# Patient Record
Sex: Male | Born: 1955 | Race: Black or African American | Hispanic: No | Marital: Married | Smoking: Never smoker
Health system: Southern US, Community
[De-identification: ages and names within clinical notes are randomized; demographics above are authoritative.]

## PROBLEM LIST (undated history)

## (undated) DIAGNOSIS — G629 Polyneuropathy, unspecified: Secondary | ICD-10-CM

## (undated) DIAGNOSIS — N400 Enlarged prostate without lower urinary tract symptoms: Secondary | ICD-10-CM

## (undated) DIAGNOSIS — Z87442 Personal history of urinary calculi: Secondary | ICD-10-CM

## (undated) HISTORY — DX: Benign prostatic hyperplasia without lower urinary tract symptoms: N40.0

---

## 2011-10-17 DIAGNOSIS — N400 Enlarged prostate without lower urinary tract symptoms: Secondary | ICD-10-CM | POA: Insufficient documentation

## 2016-02-08 DIAGNOSIS — G8929 Other chronic pain: Secondary | ICD-10-CM | POA: Insufficient documentation

## 2017-07-15 HISTORY — PX: JOINT REPLACEMENT: SHX530

## 2017-09-05 DIAGNOSIS — M1710 Unilateral primary osteoarthritis, unspecified knee: Secondary | ICD-10-CM | POA: Insufficient documentation

## 2017-12-19 DIAGNOSIS — Z96652 Presence of left artificial knee joint: Secondary | ICD-10-CM | POA: Insufficient documentation

## 2019-10-06 ENCOUNTER — Other Ambulatory Visit: Payer: Self-pay | Admitting: Family Medicine

## 2019-10-06 ENCOUNTER — Ambulatory Visit (INDEPENDENT_AMBULATORY_CARE_PROVIDER_SITE_OTHER): Payer: 59 | Admitting: Family Medicine

## 2019-10-06 ENCOUNTER — Other Ambulatory Visit: Payer: Self-pay

## 2019-10-06 ENCOUNTER — Encounter: Payer: Self-pay | Admitting: Family Medicine

## 2019-10-06 VITALS — BP 130/80 | HR 55 | Temp 98.2°F | Ht 66.0 in | Wt 179.0 lb

## 2019-10-06 DIAGNOSIS — Z1329 Encounter for screening for other suspected endocrine disorder: Secondary | ICD-10-CM | POA: Diagnosis not present

## 2019-10-06 DIAGNOSIS — R3912 Poor urinary stream: Secondary | ICD-10-CM

## 2019-10-06 DIAGNOSIS — N529 Male erectile dysfunction, unspecified: Secondary | ICD-10-CM | POA: Diagnosis not present

## 2019-10-06 DIAGNOSIS — Z13220 Encounter for screening for lipoid disorders: Secondary | ICD-10-CM

## 2019-10-06 DIAGNOSIS — Z96652 Presence of left artificial knee joint: Secondary | ICD-10-CM | POA: Diagnosis not present

## 2019-10-06 DIAGNOSIS — Z130 Encounter for screening for diseases of the blood and blood-forming organs and certain disorders involving the immune mechanism: Secondary | ICD-10-CM | POA: Diagnosis not present

## 2019-10-06 DIAGNOSIS — Z0000 Encounter for general adult medical examination without abnormal findings: Secondary | ICD-10-CM

## 2019-10-06 DIAGNOSIS — Z0001 Encounter for general adult medical examination with abnormal findings: Secondary | ICD-10-CM

## 2019-10-06 DIAGNOSIS — N401 Enlarged prostate with lower urinary tract symptoms: Secondary | ICD-10-CM | POA: Diagnosis not present

## 2019-10-06 DIAGNOSIS — Z7689 Persons encountering health services in other specified circumstances: Secondary | ICD-10-CM

## 2019-10-06 MED ORDER — SILDENAFIL CITRATE 100 MG PO TABS: 50.0000 mg | ORAL_TABLET | Freq: Every day | ORAL | 11 refills | Status: DC | PRN

## 2019-10-06 NOTE — Patient Instructions (Signed)
Fasting labs are ordered.   Come in at your convenience

## 2019-10-06 NOTE — Progress Notes (Signed)
Anders Hohmann is a 64 y.o. male presents to office today for annual physical exam examination.    Concerns today include: 1.   BPH Patient reports couple year history of BPH with urinary frequency and weak urinary stream.  He uses an over-the-counter prostate health medication but does not find that it specifically impacts his symptoms.  He notes that he was given a medication previously by previous PCP that caused excessive arousals so he discontinued the medicine.  He does have issues with erectile dysfunction and firmness.  He is able to achieve an erection.  While he prefers to rely on supplements, he would be willing to try a as needed medication for this.  No history of heart disease.  Occupation: retired from [employer], Marital status: married, Education officer, museum, Substance use: none Diet: balance, Exercise: regular Last eye exam: needs new glasses Last dental exam: UTD Last colonoscopy: UTD Immunizations needed: UTD  Past Medical History:  Diagnosis Date  . BPH (benign prostatic hyperplasia)    Social History   Socioeconomic History  . Marital status: Married    Spouse name: Rise Paganini  . Number of children: 2  . Years of education: Not on file  . Highest education level: Not on file  Occupational History  . 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 Smoking status: Never Smoker  . Smokeless tobacco: Never Used  Substance and Sexual Activity  . Alcohol use: Never  . Drug use: Never  . Sexual activity: Not on file  Other Topics Concern  . Not on file  Social History Narrative   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  They have 2 children, a son and a daughter who reside in [place].   Patient enjoys being physically active.  He tries to maintain health through proper diet, exercise and herbal remedies.   Social Determinants of Health    Financial Resource Strain:   . Difficulty of Paying Living Expenses:   Food Insecurity:   . Worried About Charity fundraiser in the Last Year:   . Arboriculturist in the Last Year:   Transportation Needs:   . Film/video editor (Medical):   Marland Kitchen Lack of Transportation (Non-Medical):   Physical Activity:   . Days of Exercise per Week:   . Minutes of Exercise per Session:   Stress:   . Feeling of Stress :   Social Connections:   . Frequency of Communication with Friends and Family:   . Frequency of Social Gatherings with Friends and Family:   . Attends Religious Services:   . Active Member of Clubs or Organizations:   . Attends Archivist Meetings:   Marland Kitchen Marital Status:   Intimate Partner Violence:   . Fear of Current or Ex-Partner:   . Emotionally Abused:   Marland Kitchen Physically Abused:   . Sexually Abused:    Past Surgical History:  Procedure Laterality Date  . JOINT REPLACEMENT  2019   left knee   Family History  Problem Relation Age of Onset  . Heart disease Mother   . Lung cancer Brother     Current Outpatient Medications:  .  sildenafil (VIAGRA) 100 MG tablet, Take 0.5-1 tablets (50-100 mg total) by mouth daily as needed for erectile dysfunction. Please give max qty per ins allowance, Disp: 10 tablet, Rfl: 11  No  Active Allergies   ROS: Review of Systems Pertinent items noted in HPI and remainder of comprehensive ROS otherwise negative.    Physical exam BP 130/80   Pulse (!) 55   Temp 98.2 F (36.8 C) (Temporal)   Ht 5' 6"  (1.676 m)   Wt 179 lb (81.2 kg)   SpO2 97%   BMI 28.89 kg/m  General appearance: alert, cooperative, appears stated age and no distress Head: Normocephalic, without obvious abnormality, atraumatic Eyes: negative findings: lids and lashes normal, conjunctivae and sclerae normal, corneas clear and pupils equal, round, reactive to light and accomodation Ears: normal TM's and external ear canals both ears Nose: Nares normal. Septum  midline. Mucosa normal. No drainage or sinus tenderness. Throat: lips, mucosa, and tongue normal; teeth and gums normal Neck: no adenopathy, no carotid bruit, no JVD, supple, symmetrical, trachea midline and thyroid not enlarged, symmetric, no tenderness/mass/nodules Back: symmetric, no curvature. ROM normal. No CVA tenderness. Lungs: clear to auscultation bilaterally Chest wall: no tenderness Heart: regular rate and rhythm, S1, S2 normal, no murmur, click, rub or gallop Abdomen: soft, non-tender; bowel sounds normal; no masses,  no organomegaly Extremities: extremities normal, atraumatic, no cyanosis or edema Pulses: 2+ and symmetric Skin: Skin color, texture, turgor normal. No rashes or lesions Lymph nodes: Cervical, supraclavicular, and axillary nodes normal. Neurologic: Alert and oriented X 3, normal strength and tone. Normal symmetric reflexes. Normal coordination and gait Psych: Pleasant, interactive.  Mood stable, speech normal, affect appropriate.  Good eye contact. Depression screen [facility] 2/9 10/06/2019  Decreased Interest 0  Down, Depressed, Hopeless 0  PHQ - 2 Score 0  Altered sleeping 0  Tired, decreased energy 0  Change in appetite 0  Feeling bad or failure about yourself  0  Trouble concentrating 0  Moving slowly or fidgety/restless 0  Suicidal thoughts 0  PHQ-9 Score 0   Assessment/ Plan: Ella Guillotte here for annual physical exam.   1. Annual physical exam I reviewed the records available.  I have put these in the pile for scanning into his electronic medical record  2. Erectile dysfunction, unspecified erectile dysfunction type Trial of Viagra.  We discussed red flag signs and symptoms and reasons for emergent evaluation. - sildenafil (VIAGRA) 100 MG tablet; Take 0.5-1 tablets (50-100 mg total) by mouth daily as needed for erectile dysfunction. Please give max qty per ins allowance  Dispense: 10 tablet; Refill: 11 - CMP14+EGFR; Future  3. Establishing care with  new doctor, encounter for  4. Status post left knee replacement Doing well.  Remains physically active  5. Benign prostatic hyperplasia with weak urinary stream Advised no intercourse for 24 hours prior to exam. - PSA; Future  6. Screening, lipid - Lipid panel; Future  7. Screening for thyroid disorder - TSH; Future  8. Screening, anemia, deficiency, iron - CBC; Future  Patient to follow up in 1 year for annual exam or sooner if needed.  Isaiha Asare M. Lajuana Ripple, DO

## 2019-10-13 ENCOUNTER — Other Ambulatory Visit: Payer: Self-pay

## 2019-10-13 ENCOUNTER — Other Ambulatory Visit: Payer: 59

## 2019-10-13 DIAGNOSIS — Z1329 Encounter for screening for other suspected endocrine disorder: Secondary | ICD-10-CM

## 2019-10-13 DIAGNOSIS — Z130 Encounter for screening for diseases of the blood and blood-forming organs and certain disorders involving the immune mechanism: Secondary | ICD-10-CM

## 2019-10-13 DIAGNOSIS — N401 Enlarged prostate with lower urinary tract symptoms: Secondary | ICD-10-CM

## 2019-10-13 DIAGNOSIS — N529 Male erectile dysfunction, unspecified: Secondary | ICD-10-CM

## 2019-10-13 DIAGNOSIS — R3912 Poor urinary stream: Secondary | ICD-10-CM

## 2019-10-13 DIAGNOSIS — Z13220 Encounter for screening for lipoid disorders: Secondary | ICD-10-CM

## 2019-10-14 LAB — CMP14+EGFR
ALT: 17 IU/L (ref 0–44)
AST: 21 IU/L (ref 0–40)
Albumin/Globulin Ratio: 2.2 (ref 1.2–2.2)
Albumin: 4.3 g/dL (ref 3.8–4.8)
Alkaline Phosphatase: 74 IU/L (ref 39–117)
BUN/Creatinine Ratio: 12 (ref 10–24)
BUN: 11 mg/dL (ref 8–27)
Bilirubin Total: 0.2 mg/dL (ref 0.0–1.2)
CO2: 22 mmol/L (ref 20–29)
Calcium: 8.9 mg/dL (ref 8.6–10.2)
Chloride: 105 mmol/L (ref 96–106)
Creatinine, Ser: 0.94 mg/dL (ref 0.76–1.27)
GFR calc Af Amer: 100 mL/min/{1.73_m2} (ref >59)
GFR calc non Af Amer: 87 mL/min/{1.73_m2} (ref >59)
Globulin, Total: 2 g/dL (ref 1.5–4.5)
Glucose: 97 mg/dL (ref 65–99)
Potassium: 4.2 mmol/L (ref 3.5–5.2)
Sodium: 140 mmol/L (ref 134–144)
Total Protein: 6.3 g/dL (ref 6.0–8.5)

## 2019-10-14 LAB — CBC
Hematocrit: 44.9 % (ref 37.5–51.0)
Hemoglobin: 14.7 g/dL (ref 13.0–17.7)
MCH: 29.1 pg (ref 26.6–33.0)
MCHC: 32.7 g/dL (ref 31.5–35.7)
MCV: 89 fL (ref 79–97)
Platelets: 220 10*3/uL (ref 150–450)
RBC: 5.06 x10E6/uL (ref 4.14–5.80)
RDW: 13 % (ref 11.6–15.4)
WBC: 4.7 10*3/uL (ref 3.4–10.8)

## 2019-10-14 LAB — LIPID PANEL
Chol/HDL Ratio: 4.2 ratio (ref 0.0–5.0)
Cholesterol, Total: 152 mg/dL (ref 100–199)
HDL: 36 mg/dL — ABNORMAL LOW (ref >39)
LDL Chol Calc (NIH): 94 mg/dL (ref 0–99)
Triglycerides: 122 mg/dL (ref 0–149)
VLDL Cholesterol Cal: 22 mg/dL (ref 5–40)

## 2019-10-14 LAB — PSA: Prostate Specific Ag, Serum: 4.7 ng/mL — ABNORMAL HIGH (ref 0.0–4.0)

## 2019-10-14 LAB — TSH: TSH: 2.01 u[IU]/mL (ref 0.450–4.500)

## 2020-07-15 HISTORY — PX: EYE SURGERY: SHX253

## 2020-08-09 DIAGNOSIS — H40023 Open angle with borderline findings, high risk, bilateral: Secondary | ICD-10-CM | POA: Insufficient documentation

## 2020-08-09 DIAGNOSIS — H25813 Combined forms of age-related cataract, bilateral: Secondary | ICD-10-CM | POA: Insufficient documentation

## 2021-01-30 ENCOUNTER — Encounter: Payer: Self-pay | Admitting: Family Medicine

## 2021-01-30 ENCOUNTER — Other Ambulatory Visit: Payer: Self-pay

## 2021-01-30 ENCOUNTER — Ambulatory Visit (INDEPENDENT_AMBULATORY_CARE_PROVIDER_SITE_OTHER): Payer: No Typology Code available for payment source | Admitting: Family Medicine

## 2021-01-30 VITALS — BP 140/86 | HR 64 | Temp 97.8°F | Ht 66.0 in | Wt 174.2 lb

## 2021-01-30 DIAGNOSIS — Z0001 Encounter for general adult medical examination with abnormal findings: Secondary | ICD-10-CM | POA: Diagnosis not present

## 2021-01-30 DIAGNOSIS — Z0000 Encounter for general adult medical examination without abnormal findings: Secondary | ICD-10-CM

## 2021-01-30 DIAGNOSIS — Z13220 Encounter for screening for lipoid disorders: Secondary | ICD-10-CM | POA: Diagnosis not present

## 2021-01-30 DIAGNOSIS — N401 Enlarged prostate with lower urinary tract symptoms: Secondary | ICD-10-CM

## 2021-01-30 DIAGNOSIS — R972 Elevated prostate specific antigen [PSA]: Secondary | ICD-10-CM | POA: Diagnosis not present

## 2021-01-30 DIAGNOSIS — R3912 Poor urinary stream: Secondary | ICD-10-CM

## 2021-01-30 DIAGNOSIS — Z1329 Encounter for screening for other suspected endocrine disorder: Secondary | ICD-10-CM

## 2021-01-30 NOTE — Patient Instructions (Signed)
Please remember to have your labs sent to me.  Shingles vaccinations need to be separated by at least 2 months but no more than 6.  Shoot for about a 4 month nurse visit for 2nd vaccine  Health Maintenance, Male Adopting a healthy lifestyle and getting preventive care are important in promoting health and wellness. Ask your health care provider about: The right schedule for you to have regular tests and exams. Things you can do on your own to prevent diseases and keep yourself healthy. What should I know about diet, weight, and exercise? Eat a healthy diet  Eat a diet that includes plenty of vegetables, fruits, low-fat dairy products, and lean protein. Do not eat a lot of foods that are high in solid fats, added sugars, or sodium.  Maintain a healthy weight Body mass index (BMI) is a measurement that can be used to identify possible weight problems. It estimates body fat based on height and weight. Your health care provider can help determine your BMI and help you achieve or maintain ahealthy weight. Get regular exercise Get regular exercise. This is one of the most important things you can do for your health. Most adults should: Exercise for at least 150 minutes each week. The exercise should increase your heart rate and make you sweat (moderate-intensity exercise). Do strengthening exercises at least twice a week. This is in addition to the moderate-intensity exercise. Spend less time sitting. Even light physical activity can be beneficial. Watch cholesterol and blood lipids Have your blood tested for lipids and cholesterol at 64 years of age, then havethis test every 5 years. You may need to have your cholesterol levels checked more often if: Your lipid or cholesterol levels are high. You are older than 64 years of age. You are at high risk for heart disease. What should I know about cancer screening? Many types of cancers can be detected early and may often be prevented. Depending on  your health history and family history, you may need to have cancer screening at various ages. This may include screening for: Colorectal cancer. Prostate cancer. Skin cancer. Lung cancer. What should I know about heart disease, diabetes, and high blood pressure? Blood pressure and heart disease High blood pressure causes heart disease and increases the risk of stroke. This is more likely to develop in people who have high blood pressure readings, are of African descent, or are overweight. Talk with your health care provider about your target blood pressure readings. Have your blood pressure checked: Every 3-5 years if you are 60-60 years of age. Every year if you are 15 years old or older. If you are between the ages of 36 and 22 and are a current or former smoker, ask your health care provider if you should have a one-time screening for abdominal aortic aneurysm (AAA). Diabetes Have regular diabetes screenings. This checks your fasting blood sugar level. Have the screening done: Once every three years after age 69 if you are at a normal weight and have a low risk for diabetes. More often and at a younger age if you are overweight or have a high risk for diabetes. What should I know about preventing infection? Hepatitis B If you have a higher risk for hepatitis B, you should be screened for this virus. Talk with your health care provider to find out if you are at risk forhepatitis B infection. Hepatitis C Blood testing is recommended for: Everyone born from [birth year] through 1964. Anyone with known risk factors for  hepatitis C. Sexually transmitted infections (STIs) You should be screened each year for STIs, including gonorrhea and chlamydia, if: You are sexually active and are younger than 64 years of age. You are older than 64 years of age and your health care provider tells you that you are at risk for this type of infection. Your sexual activity has changed since you were last screened,  and you are at increased risk for chlamydia or gonorrhea. Ask your health care provider if you are at risk. Ask your health care provider about whether you are at high risk for HIV. Your health care provider may recommend a prescription medicine to help prevent HIV infection. If you choose to take medicine to prevent HIV, you should first get tested for HIV. You should then be tested every 3 months for as long as you are taking the medicine. Follow these instructions at home: Lifestyle Do not use any products that contain nicotine or tobacco, such as cigarettes, e-cigarettes, and chewing tobacco. If you need help quitting, ask your health care provider. Do not use street drugs. Do not share needles. Ask your health care provider for help if you need support or information about quitting drugs. Alcohol use Do not drink alcohol if your health care provider tells you not to drink. If you drink alcohol: Limit how much you have to 0-2 drinks a day. Be aware of how much alcohol is in your drink. In the [place] one drink equals one 12 oz bottle of beer (355 mL), one 5 oz glass of wine (148 mL), or one 1 oz glass of hard liquor (44 mL). General instructions Schedule regular health, dental, and eye exams. Stay current with your vaccines. Tell your health care provider if: You often feel depressed. You have ever been abused or do not feel safe at home. Summary Adopting a healthy lifestyle and getting preventive care are important in promoting health and wellness. Follow your health care provider's instructions about healthy diet, exercising, and getting tested or screened for diseases. Follow your health care provider's instructions on monitoring your cholesterol and blood pressure. This information is not intended to replace advice given to you by your health care provider. Make sure you discuss any questions you have with your healthcare provider. Document Revised: 06/24/2018 Document Reviewed:  06/24/2018 Elsevier Patient Education  2022 [employer].

## 2021-01-30 NOTE — Progress Notes (Signed)
Geoffrey Bishop is a 65 y.o. male presents to office today for annual physical exam examination.    Concerns today include: 1. ?Nail fungus Has 3 toenails noted to be affected by what looks to be a fungus with associated thickening.  He has been using an over-the-counter remedy for the last several months with only minimal improvement  Occupation: Retired from [employer], Marital status: Married, Substance use: None Diet: Balanced, Exercise: Regular exercise, tennis Last eye exam: Up-to-date, recent right cataract surgery Last dental exam: Up-to-date Last colonoscopy: Up-to-date Refills needed today: N/A Immunizations needed: Shingles vaccine Immunization History  Administered Date(s) Administered   Moderna Sars-Covid-2 Vaccination 09/22/2019, 10/20/2019, 05/10/2020   Tdap 07/15/1997, 08/31/2007     Past Medical History:  Diagnosis Date   BPH (benign prostatic hyperplasia)    Social History   Socioeconomic History   Marital status: Married    Spouse name: Rise Paganini   Number of children: 2   Years of education: Not on file   Highest education level: Not on file  Occupational History   Occupation: retired     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  They have 2 children, a son and a daughter who reside in [place].   Patient enjoys being physically active.  He tries to maintain health through proper diet, exercise and herbal remedies.   Social Determinants of Health   Financial Resource Strain: Not on file  Food Insecurity: Not on file  Transportation Needs: Not on file   Physical Activity: Not on file  Stress: Not on file  Social Connections: Not on file  Intimate Partner Violence: Not on file   Past Surgical History:  Procedure Laterality Date   EYE SURGERY  2022   JOINT REPLACEMENT  2019   left knee   Family History  Problem Relation Age of Onset   Heart disease Mother    Lung cancer Brother    No current outpatient medications on file.  No Active Allergies   ROS: Review of Systems Pertinent items noted in HPI and remainder of comprehensive ROS otherwise negative.    Physical exam BP 140/86   Pulse 64   Temp 97.8 F (36.6 C)   Ht 5' 6"  (1.676 m)   Wt 174 lb 3.2 oz (79 kg)   SpO2 95%   BMI 28.12 kg/m  General appearance: alert, cooperative, appears stated age, and no distress Head: Normocephalic, without obvious abnormality, atraumatic Eyes: negative findings: lids and lashes normal, conjunctivae and sclerae normal, corneas clear, pupils equal, round, reactive to light and accomodation, and evidence of cataract surgery and right with lens noted Ears: normal TM's and external ear canals both ears Nose: Nares normal. Septum midline. Mucosa normal. No drainage or sinus tenderness. Throat: lips, mucosa, and tongue normal; teeth and gums normal Neck: no adenopathy, supple, symmetrical, trachea midline, and thyroid not enlarged, symmetric, no tenderness/mass/nodules Back: symmetric, no curvature. ROM normal. No CVA tenderness. Lungs: clear to auscultation bilaterally Chest wall: no tenderness Heart: regular rate and rhythm, S1, S2  normal, no murmur, click, rub or gallop Abdomen: soft, non-tender; bowel sounds normal; no masses,  no organomegaly Extremities: extremities normal, atraumatic, no cyanosis or edema Pulses: 2+ and symmetric Skin: Skin color, texture, turgor normal. No rashes or lesions Lymph nodes: Cervical, supraclavicular, and axillary nodes normal. Neurologic: Grossly normal    Assessment/ Plan: [name] here  for annual physical exam.   Annual physical exam  Benign prostatic hyperplasia with weak urinary stream - Plan: CMP14+EGFR, PSA  Elevated PSA - Plan: CMP14+EGFR, PSA, CBC  Screening, lipid - Plan: Lipid panel  Screening for thyroid disorder - Plan: TSH  We are out of the shingles vaccine but patient will come back to have administered.  I ordered labs but in fact he is just had these collected with his wife's work and he will get these results sent to me Apparently PSA normalized on its own.  He did not need to see urology for this  Patient to follow up in 1 year for annual exam or sooner if needed.  Yolando Gillum M. Lajuana Ripple, DO

## 2021-05-16 ENCOUNTER — Ambulatory Visit (INDEPENDENT_AMBULATORY_CARE_PROVIDER_SITE_OTHER): Payer: No Typology Code available for payment source | Admitting: *Deleted

## 2021-05-16 ENCOUNTER — Other Ambulatory Visit: Payer: Self-pay

## 2021-05-16 DIAGNOSIS — Z23 Encounter for immunization: Secondary | ICD-10-CM

## 2021-06-27 ENCOUNTER — Ambulatory Visit: Payer: No Typology Code available for payment source | Admitting: Family Medicine

## 2021-06-27 ENCOUNTER — Ambulatory Visit (INDEPENDENT_AMBULATORY_CARE_PROVIDER_SITE_OTHER): Payer: No Typology Code available for payment source

## 2021-06-27 ENCOUNTER — Encounter: Payer: Self-pay | Admitting: Family Medicine

## 2021-06-27 VITALS — BP 139/87 | HR 69 | Temp 98.1°F | Ht 66.0 in | Wt 181.2 lb

## 2021-06-27 DIAGNOSIS — R059 Cough, unspecified: Secondary | ICD-10-CM | POA: Diagnosis not present

## 2021-06-27 MED ORDER — PANTOPRAZOLE SODIUM 40 MG PO TBEC: 40.0000 mg | DELAYED_RELEASE_TABLET | Freq: Every day | ORAL | 0 refills | Status: DC

## 2021-06-27 NOTE — Patient Instructions (Signed)
I think this is a silent reflux.  I reviewed your chest xray and saw nothing grossly unusual.  The radiologist will also review later and we'll give you a call if anything different is read.  Going to put you on a trial of Protonix to see if this alleviates your symptoms. If so, great!  If not, we'll plan for referral to the lung specialist.  Let's see each other in a month, phone is fine if that's easier for you.

## 2021-06-27 NOTE — Progress Notes (Signed)
Subjective: CC: Cough PCP: Raliegh Ip, DO [id number] Hone is a 65 y.o. male presenting to clinic today for:  1.  Cough Patient reports he had a cough for about a month and a half now.  Initially started a little productive but has escalated to a dry cough.  He denies any brown sputum, blood, unplanned weight loss, night sweats, shortness of breath or change in exercise tolerance.  In fact he is very physically active and just had a round of tennis today.  The cough seems to be worse when he is lying down.  Sometimes it wakes him from sleep and he has to sit up in order for it to resolve.  He denies any burning sensation in his stomach.  Has not noticed any association with food.  He denies any postnasal drainage, rhinorrhea, sneezing.  He is a non-smoker   ROS: Per HPI  No Active Allergies Past Medical History:  Diagnosis Date   BPH (benign prostatic hyperplasia)    No current outpatient medications on file. Social History   Socioeconomic History   Marital status: Married    Spouse name: Meriam Sprague   Number of children: 2   Years of education: Not on file   Highest education level: Not on file  Occupational History   Occupation: retired     Comment: used to be a Production designer, theatre/television/film at [employer]  Tobacco Use   Smoking status: Never   Smokeless tobacco: Never  Vaping Use   Vaping Use: Never used  Substance and Sexual Activity   Alcohol use: Never   Drug use: Never   Sexual activity: Not on file  Other Topics Concern   Not on file  Social History Narrative   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  They have 2 children, a son and a daughter who reside in [place].   Patient enjoys being physically active.  He tries to maintain health through proper diet, exercise and herbal remedies.   Social Determinants of Health   Financial Resource Strain: Not on file  Food Insecurity: Not  on file  Transportation Needs: Not on file  Physical Activity: Not on file  Stress: Not on file  Social Connections: Not on file  Intimate Partner Violence: Not on file   Family History  Problem Relation Age of Onset   Heart disease Mother    Lung cancer Brother     Objective: Office vital signs reviewed. BP 139/87    Pulse 69    Temp 98.1 F (36.7 C)    Ht 5\' 6"  (1.676 m)    Wt 181 lb 3.2 oz (82.2 kg)    SpO2 97%    BMI 29.25 kg/m   Physical Examination:  General: Awake, alert, well nourished, No acute distress HEENT: No oropharyngeal erythema, cobblestoning or masses Cardio: regular rate and rhythm, S1S2 heard, no murmurs appreciated Pulm: clear to auscultation bilaterally, no wheezes, rhonchi or rales; normal work of breathing on room air GI: soft, non-tender, non-distended, bowel sounds present x4, no hepatomegaly, no splenomegaly, no masses   Assessment/ Plan: 65 y.o. male   Cough in adult - Plan: DG Chest 2 View, pantoprazole (PROTONIX) 40 MG tablet  Chest x-ray obtained due to longevity of cough though he has low risk for lung pathology.  Personal review of the chest x-ray demonstrated no acute findings but awaiting formal review by radiology.  I suspect this may  be a silent reflux given the positional nature of it.  I am going to place him on a trial of Protonix and then see him back in a month.  If they have had no significant changes in the cough then we will plan for referral to pulmonology.  No orders of the defined types were placed in this encounter.  No orders of the defined types were placed in this encounter.    Raliegh Ip, DO [facility] [phone]

## 2021-06-30 ENCOUNTER — Encounter: Payer: Self-pay | Admitting: Family Medicine

## 2021-07-19 ENCOUNTER — Other Ambulatory Visit: Payer: Self-pay | Admitting: Family Medicine

## 2021-07-19 DIAGNOSIS — R059 Cough, unspecified: Secondary | ICD-10-CM

## 2021-07-31 ENCOUNTER — Ambulatory Visit: Payer: No Typology Code available for payment source | Admitting: Family Medicine

## 2021-07-31 ENCOUNTER — Encounter: Payer: Self-pay | Admitting: Family Medicine

## 2021-07-31 DIAGNOSIS — R059 Cough, unspecified: Secondary | ICD-10-CM

## 2021-07-31 DIAGNOSIS — K219 Gastro-esophageal reflux disease without esophagitis: Secondary | ICD-10-CM | POA: Diagnosis not present

## 2021-07-31 NOTE — Progress Notes (Signed)
Telephone visit  Subjective: CC: Cough PCP: Geoffrey Norlander, DO [id number] [name] is a 66 y.o. male calls for telephone consult today. Patient provides verbal consent for consult held via phone.  Due to COVID-19 pandemic this visit was conducted virtually. This visit type was conducted due to national recommendations for restrictions regarding the COVID-19 Pandemic (e.g. social distancing, sheltering in place) in an effort to limit this patient's exposure and mitigate transmission in our community. All issues noted in this document were discussed and addressed.  A physical exam was not performed with this format.   Location of patient: home Location of provider: [facility] Others present for call: none  1.  Cough Patient was seen June 27, 2021 for a cough that has been present for about a month and a half.  She had an x-ray which showed no acute pulmonary infiltrates or abnormalities.  The cough was thought to be secondary to GERD and he was started on Protonix 40 mg daily.  He notes that it took about 3 weeks for it to really kick in but he has had total resolution of symptoms for over a week now.  He did receive another refill on the prescription and has that at home but was not sure if he should start that.   ROS: Per HPI  No Active Allergies Past Medical History:  Diagnosis Date   BPH (benign prostatic hyperplasia)     Current Outpatient Medications:    pantoprazole (PROTONIX) 40 MG tablet, TAKE 1 TABLET BY MOUTH EVERY DAY, Disp: 30 tablet, Rfl: 0  Assessment/ Plan: 66 y.o. male   Gastroesophageal reflux disease without esophagitis  Cough in adult  Symptoms have totally resolved with the Protonix.  He has 1 more bottle of this medication should he need it but I have advised him to go ahead and stay off of the PPI for now.  If symptoms return he may resume use and he will contact me so that he may add additional refills.  Start time: 10:11am End time: 10:16am  Total  time spent on patient care (including telephone call/ virtual visit): 5 minutes  Geoffrey Bishop, Geoffrey Bishop [phone]

## 2021-08-01 ENCOUNTER — Encounter: Payer: Self-pay | Admitting: Family Medicine

## 2021-08-01 ENCOUNTER — Other Ambulatory Visit: Payer: Self-pay | Admitting: Family Medicine

## 2021-08-01 DIAGNOSIS — N529 Male erectile dysfunction, unspecified: Secondary | ICD-10-CM

## 2021-08-08 ENCOUNTER — Ambulatory Visit (INDEPENDENT_AMBULATORY_CARE_PROVIDER_SITE_OTHER): Payer: No Typology Code available for payment source

## 2021-08-08 ENCOUNTER — Other Ambulatory Visit: Payer: Self-pay | Admitting: Family Medicine

## 2021-08-08 DIAGNOSIS — Z23 Encounter for immunization: Secondary | ICD-10-CM | POA: Diagnosis not present

## 2021-08-08 DIAGNOSIS — N529 Male erectile dysfunction, unspecified: Secondary | ICD-10-CM

## 2021-08-08 MED ORDER — SILDENAFIL CITRATE 100 MG PO TABS: 50.0000 mg | ORAL_TABLET | Freq: Every day | ORAL | 11 refills | Status: DC | PRN

## 2021-08-08 NOTE — Telephone Encounter (Signed)
Is patient amenable to change?  Some patients prefer to pay out of pocket for viagra.  Let me know and I will change.

## 2021-08-18 ENCOUNTER — Other Ambulatory Visit: Payer: Self-pay | Admitting: Family Medicine

## 2021-08-18 DIAGNOSIS — R059 Cough, unspecified: Secondary | ICD-10-CM

## 2021-08-21 ENCOUNTER — Other Ambulatory Visit: Payer: Self-pay | Admitting: Family Medicine

## 2021-08-21 DIAGNOSIS — R059 Cough, unspecified: Secondary | ICD-10-CM

## 2021-08-27 ENCOUNTER — Other Ambulatory Visit: Payer: Self-pay | Admitting: Family Medicine

## 2021-08-27 DIAGNOSIS — R059 Cough, unspecified: Secondary | ICD-10-CM

## 2021-08-27 NOTE — Telephone Encounter (Signed)
We sent in protonix initially and that was rejected.  He may simply be beyond what they will pay per calendar year.  Recommend getting OTC nexium or omeprazole for prn use.

## 2021-08-27 NOTE — Telephone Encounter (Signed)
Insurance will not cover pantaprozole. They recommend lansoprazole, esomeprazole, omeprazole. Please review

## 2021-08-28 NOTE — Telephone Encounter (Signed)
Pt aware of provider feedback and voiced understanding. 

## 2021-10-04 DIAGNOSIS — Z961 Presence of intraocular lens: Secondary | ICD-10-CM | POA: Insufficient documentation

## 2021-10-04 DIAGNOSIS — H26491 Other secondary cataract, right eye: Secondary | ICD-10-CM | POA: Insufficient documentation

## 2021-10-04 DIAGNOSIS — H25812 Combined forms of age-related cataract, left eye: Secondary | ICD-10-CM | POA: Insufficient documentation

## 2022-02-13 ENCOUNTER — Encounter: Payer: No Typology Code available for payment source | Admitting: Family Medicine

## 2022-03-20 ENCOUNTER — Encounter: Payer: No Typology Code available for payment source | Admitting: Family Medicine

## 2022-04-26 ENCOUNTER — Encounter: Payer: Self-pay | Admitting: Family Medicine

## 2022-04-26 ENCOUNTER — Ambulatory Visit (INDEPENDENT_AMBULATORY_CARE_PROVIDER_SITE_OTHER): Payer: Medicare Other | Admitting: Family Medicine

## 2022-04-26 VITALS — BP 127/84 | HR 57 | Temp 97.7°F | Ht 66.0 in | Wt 174.2 lb

## 2022-04-26 DIAGNOSIS — K219 Gastro-esophageal reflux disease without esophagitis: Secondary | ICD-10-CM

## 2022-04-26 DIAGNOSIS — Z0001 Encounter for general adult medical examination with abnormal findings: Secondary | ICD-10-CM | POA: Diagnosis not present

## 2022-04-26 DIAGNOSIS — R972 Elevated prostate specific antigen [PSA]: Secondary | ICD-10-CM

## 2022-04-26 DIAGNOSIS — G629 Polyneuropathy, unspecified: Secondary | ICD-10-CM | POA: Diagnosis not present

## 2022-04-26 DIAGNOSIS — Z0000 Encounter for general adult medical examination without abnormal findings: Secondary | ICD-10-CM

## 2022-04-26 DIAGNOSIS — R3912 Poor urinary stream: Secondary | ICD-10-CM

## 2022-04-26 DIAGNOSIS — Z23 Encounter for immunization: Secondary | ICD-10-CM

## 2022-04-26 DIAGNOSIS — N401 Enlarged prostate with lower urinary tract symptoms: Secondary | ICD-10-CM

## 2022-04-26 DIAGNOSIS — R739 Hyperglycemia, unspecified: Secondary | ICD-10-CM | POA: Diagnosis not present

## 2022-04-26 LAB — BAYER DCA HB A1C WAIVED: HB A1C (BAYER DCA - WAIVED): 5.8 % — ABNORMAL HIGH (ref 4.8–5.6)

## 2022-04-26 NOTE — Patient Instructions (Signed)
Checking PSA again.  If still elevated, plan for referral to urology Prostate Cancer Screening  Prostate cancer screening is testing that is done to check for the presence of prostate cancer in men. The prostate gland is a walnut-sized gland that is located below the bladder and in front of the rectum in males. The function of the prostate is to add fluid to semen during ejaculation. Prostate cancer is one of the most common types of cancer in men. Who should have prostate cancer screening? Screening recommendations vary based on age and other risk factors, as well as between the professional organizations who make the recommendations. In general, screening is recommended if: You are age 7 to 66 and have an average risk for prostate cancer. You should talk with your health care provider about your need for screening and how often screening should be done. Because most prostate cancers are slow growing and will not cause death, screening in this age group is generally reserved for men who have a 61- to 15-year life expectancy. You are younger than age 87, and you have these risk factors: Having a father, brother, or uncle who has been diagnosed with prostate cancer. The risk is higher if your family member's cancer occurred at an early age or if you have multiple family members with prostate cancer at an early age. Being a male who is [place] or is of [place] or sub-Saharan African descent. In general, screening is not recommended if: You are younger than age 4. You are between the ages of 13 and 70 and you have no risk factors. You are 52 years of age or older. At this age, the risks that screening can cause are greater than the benefits that it may provide. If you are at high risk for prostate cancer, your health care provider may recommend that you have screenings more often or that you start screening at a younger age. How is screening for prostate cancer done? The recommended prostate cancer  screening test is a blood test called the prostate-specific antigen (PSA) test. PSA is a protein that is made in the prostate. As you age, your prostate naturally produces more PSA. Abnormally high PSA levels may be caused by: Prostate cancer. An enlarged prostate that is not caused by cancer (benign prostatic hyperplasia, or BPH). This condition is very common in older men. A prostate gland infection (prostatitis) or urinary tract infection. Certain medicines such as male hormones (like testosterone) or other medicines that raise testosterone levels. A rectal exam may be done as part of prostate cancer screening to help provide information about the size of your prostate gland. When a rectal exam is performed, it should be done after the PSA level is drawn to avoid any effect on the results. Depending on the PSA results, you may need more tests, such as: A physical exam to check the size of your prostate gland, if not done as part of screening. Blood and imaging tests. A procedure to remove tissue samples from your prostate gland for testing (biopsy). This is the only way to know for certain if you have prostate cancer. What are the benefits of prostate cancer screening? Screening can help to identify cancer at an early stage, before symptoms start and when the cancer can be treated more easily. There is a small chance that screening may lower your risk of dying from prostate cancer. The chance is small because prostate cancer is a slow-growing cancer, and most men with prostate cancer die from  a different cause. What are the risks of prostate cancer screening? The main risk of prostate cancer screening is diagnosing and treating prostate cancer that would never have caused any symptoms or problems. This is called overdiagnosisand overtreatment. PSA screening cannot tell you if your PSA is high due to cancer or a different cause. A prostate biopsy is the only procedure to diagnose prostate cancer.  Even the results of a biopsy may not tell you if your cancer needs to be treated. Slow-growing prostate cancer may not need any treatment other than monitoring, so diagnosing and treating it may cause unnecessary stress or other side effects. Questions to ask your health care provider When should I start prostate cancer screening? What is my risk for prostate cancer? How often do I need screening? What type of screening tests do I need? How do I get my test results? What do my results mean? Do I need treatment? Where to find more information The American Cancer Society: www.cancer.org American Urological Association: www.auanet.org Contact a health care provider if: You have difficulty urinating. You have pain when you urinate or ejaculate. You have blood in your urine or semen. You have pain in your back or in the area of your prostate. Summary Prostate cancer is a common type of cancer in men. The prostate gland is located below the bladder and in front of the rectum. This gland adds fluid to semen during ejaculation. Prostate cancer screening may identify cancer at an early stage, when the cancer can be treated more easily and is less likely to have spread to other areas of the body. The prostate-specific antigen (PSA) test is the recommended screening test for prostate cancer, but it has associated risks. Discuss the risks and benefits of prostate cancer screening with your health care provider. If you are age 77 or older, the risks that screening can cause are greater than the benefits that it may provide. This information is not intended to replace advice given to you by your health care provider. Make sure you discuss any questions you have with your health care provider. Document Revised: 12/25/2020 Document Reviewed: 12/25/2020 Elsevier Patient Education  [place].

## 2022-04-26 NOTE — Progress Notes (Signed)
Geoffrey Bishop is a 66 y.o. male presents to office today for annual physical exam examination.    Concerns today include: 1.  Neuropathy Patient reports that he gets intermittent numbness and tingling in bilateral feet.  Initially started in the right side but now occurs in both sides.  This seems to be more prevalent after he has played tennis for a while.  Sometimes the numbness will become so severe that he has to sit down and rest for a bit.  He denies any discoloration of his feet.  He has not had any intolerance of physical activity otherwise.  He is a non-smoker.  He takes B12 orally.  Maintains an extremely healthy lifestyle so no concern for diabetes.  He does have history of knee surgery, which he initially thought may be contributory  Occupation: Retired, Marital status: Married, Substance use: None Diet: Bowel onset, Exercise: Regularly plays tennis and stays physically active Last eye exam: Up to date.  Status post cataract surgery on the right in February 2022 Last dental exam: Up-to-date Last colonoscopy: Up to date.  ROI completed Refills needed today: None Immunizations needed: Declines flu and pneumonia.  Will take tetanus Immunization History  Administered Date(s) Administered   Moderna Sars-Covid-2 Vaccination 09/22/2019, 10/20/2019, 05/10/2020   Tdap 07/15/1997, 08/31/2007   Zoster Recombinat (Shingrix) 05/16/2021, 08/08/2021     Past Medical History:  Diagnosis Date   BPH (benign prostatic hyperplasia)    Social History   Socioeconomic History   Marital status: Married    Spouse name: Geoffrey Bishop   Number of children: 2   Years of education: Not on file   Highest education level: Not on file  Occupational History   Occupation: retired     Comment: used to be a Freight forwarder at [employer]  Tobacco Use   Smoking status: Never   Smokeless tobacco: Never  Vaping Use   Vaping Use: Never used  Substance and Sexual Activity   Alcohol use: Never   Drug use: Never    Sexual activity: Not on file  Other Topics Concern   Not on file  Social History Narrative   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  They have 2 children, a son and a daughter who reside in [place].   Patient enjoys being physically active.  He tries to maintain health through proper diet, exercise and herbal remedies.   Social Determinants of Health   Financial Resource Strain: Not on file  Food Insecurity: Not on file  Transportation Needs: Not on file  Physical Activity: Not on file  Stress: Not on file  Social Connections: Not on file  Intimate Partner Violence: Not on file   Past Surgical History:  Procedure Laterality Date   EYE SURGERY  2022   JOINT REPLACEMENT  2019   left knee   Family History  Problem Relation Age of Onset   Heart disease Mother    Lung cancer Brother     Current Outpatient Medications:    lansoprazole (PREVACID) 30 MG capsule, Take 1 capsule (30 mg total) by mouth daily as needed (GERD flareups (protonix not covered so this is a replacement))., Disp: 90 capsule, Rfl: 0   vardenafil (LEVITRA) 20 MG tablet, Take 1 tablet (20 mg total) by mouth daily as needed for erectile dysfunction., Disp: 10 tablet, Rfl: 0  No Active Allergies   ROS: Review of Systems Pertinent items noted in HPI  and remainder of comprehensive ROS otherwise negative.    Physical exam BP 127/84   Pulse (!) 57   Temp 97.7 F (36.5 C)   Ht 5\' 6"  (1.676 m)   Wt 174 lb 3.2 oz (79 kg)   SpO2 97%   BMI 28.12 kg/m  General appearance: alert, cooperative, appears stated age, and no distress Head: Normocephalic, without obvious abnormality, atraumatic Eyes: negative findings: lids and lashes normal, conjunctivae and sclerae normal, corneas clear, and pupils equal, round, reactive to light and accomodation Ears: normal TM's and external ear canals both ears Nose: Nares normal.  Septum midline. Mucosa normal. No drainage or sinus tenderness. Throat: lips, mucosa, and tongue normal; teeth and gums normal Neck: no adenopathy, no carotid bruit, no JVD, supple, symmetrical, trachea midline, and thyroid not enlarged, symmetric, no tenderness/mass/nodules Back: symmetric, no curvature. ROM normal. No CVA tenderness. Lungs: clear to auscultation bilaterally Chest wall: no tenderness Heart: regular rate and rhythm, S1, S2 normal, no murmur, click, rub or gallop Abdomen: soft, non-tender; bowel sounds normal; no masses,  no organomegaly Extremities: extremities normal, atraumatic, no cyanosis or edema Pulses: 2+ and symmetric Skin: Skin color, texture, turgor normal. No rashes or lesions Lymph nodes: Cervical, supraclavicular, and axillary nodes normal. Neurologic: Grossly normal Psych: Mood stable, speech normal, affect appropriate  Flowsheet Row Office Visit from 04/26/2022 in [place]  PHQ-2 Total Score 0         Assessment/ Plan: [name] here for annual physical exam.   Annual physical exam  Benign prostatic hyperplasia with weak urinary stream - Plan: PSA  Elevated PSA - Plan: PSA, CBC with Differential  Gastroesophageal reflux disease without esophagitis - Plan: CBC with Differential  Need for tetanus booster - Plan: Td : Tetanus/diphtheria >7yo Preservative  free  Peripheral polyneuropathy - Plan: Bayer DCA Hb A1c Waived, Vitamin B12  Check PSA.  He is unsure if this was collected with the labs that were done with his wife's work this year.  Continues to have slow urinary stream but otherwise asymptomatic.  GERD is stable with only as needed use of PPI.  Tetanus shot administered  Uncertain etiology of the intermittent neuropathy he is experiencing in the feet.  No use of alcohol.  No known exposure to heavy metals.  He takes B12 orally.  I am going to check B12 and A1c but may need to consider referral for nerve  conduction studies if labs are unrevealing  Patient to follow up in 1 year for annual exam or sooner if needed.  Erianna Jolly M. Lajuana Ripple, DO

## 2022-04-27 LAB — CBC WITH DIFFERENTIAL/PLATELET
Basophils Absolute: 0.1 10*3/uL (ref 0.0–0.2)
Basos: 1 %
EOS (ABSOLUTE): 0.1 10*3/uL (ref 0.0–0.4)
Eos: 2 %
Hematocrit: 43.4 % (ref 37.5–51.0)
Hemoglobin: 15.2 g/dL (ref 13.0–17.7)
Immature Grans (Abs): 0 10*3/uL (ref 0.0–0.1)
Immature Granulocytes: 0 %
Lymphocytes Absolute: 1.3 10*3/uL (ref 0.7–3.1)
Lymphs: 20 %
MCH: 31.2 pg (ref 26.6–33.0)
MCHC: 35 g/dL (ref 31.5–35.7)
MCV: 89 fL (ref 79–97)
Monocytes Absolute: 0.4 10*3/uL (ref 0.1–0.9)
Monocytes: 7 %
Neutrophils Absolute: 4.5 10*3/uL (ref 1.4–7.0)
Neutrophils: 70 %
Platelets: 241 10*3/uL (ref 150–450)
RBC: 4.87 x10E6/uL (ref 4.14–5.80)
RDW: 12.6 % (ref 11.6–15.4)
WBC: 6.4 10*3/uL (ref 3.4–10.8)

## 2022-04-27 LAB — PSA: Prostate Specific Ag, Serum: 6.6 ng/mL — ABNORMAL HIGH (ref 0.0–4.0)

## 2022-04-27 LAB — VITAMIN B12: Vitamin B-12: 929 pg/mL (ref 232–1245)

## 2022-04-29 ENCOUNTER — Other Ambulatory Visit: Payer: Self-pay | Admitting: Family Medicine

## 2022-04-29 DIAGNOSIS — R972 Elevated prostate specific antigen [PSA]: Secondary | ICD-10-CM

## 2022-04-29 DIAGNOSIS — G629 Polyneuropathy, unspecified: Secondary | ICD-10-CM

## 2022-04-30 LAB — HEMOGLOBIN A1C: Hemoglobin A1C: 5.6

## 2022-04-30 LAB — LIPID PANEL
HDL: 50 (ref 35–70)
LDL Cholesterol: 102
Triglycerides: 112 (ref 40–160)

## 2022-04-30 LAB — BASIC METABOLIC PANEL: Glucose: 96

## 2022-05-03 ENCOUNTER — Encounter: Payer: Self-pay | Admitting: Urology

## 2022-05-03 ENCOUNTER — Ambulatory Visit (INDEPENDENT_AMBULATORY_CARE_PROVIDER_SITE_OTHER): Payer: Medicare Other | Admitting: Urology

## 2022-05-03 VITALS — BP 148/84 | HR 55 | Ht 66.0 in | Wt 174.0 lb

## 2022-05-03 DIAGNOSIS — N400 Enlarged prostate without lower urinary tract symptoms: Secondary | ICD-10-CM | POA: Diagnosis not present

## 2022-05-03 DIAGNOSIS — R972 Elevated prostate specific antigen [PSA]: Secondary | ICD-10-CM | POA: Diagnosis not present

## 2022-05-03 LAB — URINALYSIS, ROUTINE W REFLEX MICROSCOPIC
Bilirubin, UA: NEGATIVE
Glucose, UA: NEGATIVE
Leukocytes,UA: NEGATIVE
Nitrite, UA: NEGATIVE
Protein,UA: NEGATIVE
RBC, UA: NEGATIVE
Specific Gravity, UA: 1.02 (ref 1.005–1.030)
Urobilinogen, Ur: 0.2 mg/dL (ref 0.2–1.0)
pH, UA: 5.5 (ref 5.0–7.5)

## 2022-05-03 NOTE — Patient Instructions (Signed)
   Appointment Time:0800 Appointment Date:10/25/203  Location: [facility]   Prostate Biopsy Instructions  Stop all aspirin or blood thinners (aspirin, plavix, coumadin, warfarin, motrin, ibuprofen, advil, aleve, naproxen, naprosyn) for 7 days prior to the procedure.  If you have any questions about stopping these medications, please contact your primary care physician or cardiologist.  Having a light meal prior to the procedure is recommended.  If you are diabetic or have low blood sugar please bring a small snack or glucose tablet.  A Fleets enema is needed to be purchased over the counter at a local pharmacy and used 2 hours before you scheduled appointment.  This can be purchased over the counter at any pharmacy.   Please bring someone with you to the procedure to drive you home if you are given a valium to take prior to your procedure.   If you have any questions or concerns, please feel free to call the office at [phone] or send a Mychart message.  [address], (inside [facility]), [place], [place] [address]  Thank you, [facility]

## 2022-05-03 NOTE — Progress Notes (Signed)
Assessment: 1. Elevated PSA   2. BPH without urinary obstruction     Plan: I personally reviewed the patient's records including provider notes and lab results. Today I had a long discussion with the patient regarding PSA and the rationale and controversies of prostate cancer early detection.  I discussed the pros and cons of further evaluation including TRUS and prostate Bx.  Potential adverse events and complications as well as standard instructions were given.  Patient expressed his understanding of these issues. Schedule for TRUS/BX  Chief Complaint:  Chief Complaint  Patient presents with   Elevated PSA    History of Present Illness:  Geoffrey Bishop is a 66 y.o. male who is seen in consultation from [place] M, DO for evaluation of elevated PSA. PSA results: 3/21 4.7 10/23 6.6  No prior prostate biopsy.  No family history of prostate cancer.  No history of UTIs or prostatitis. He does not have any significant urinary symptoms other than a decreased stream at times.  No dysuria or gross hematuria. IPSS = 0 today. He does have a history of erectile dysfunction and takes Levitra as needed.   Past Medical History:  Past Medical History:  Diagnosis Date   BPH (benign prostatic hyperplasia)     Past Surgical History:  Past Surgical History:  Procedure Laterality Date   EYE SURGERY  2022   JOINT REPLACEMENT  2019   left knee    Allergies:  No Active Allergies  Family History:  Family History  Problem Relation Age of Onset   Heart disease Mother    Lung cancer Brother     Social History:  Social History   Tobacco Use   Smoking status: Never   Smokeless tobacco: Never  Vaping Use   Vaping Use: Never used  Substance Use Topics   Alcohol use: Never   Drug use: Never    Review of symptoms:  Constitutional:  Negative for unexplained weight loss, night sweats, fever, chills ENT:  Negative for nose bleeds, sinus pain, painful swallowing CV:   Negative for chest pain, shortness of breath, exercise intolerance, palpitations, loss of consciousness Resp:  Negative for cough, wheezing, shortness of breath GI:  Negative for nausea, vomiting, diarrhea, bloody stools GU:  Positives noted in HPI; otherwise negative for gross hematuria, dysuria, urinary incontinence Neuro:  Negative for seizures, poor balance, limb weakness, slurred speech Psych:  Negative for lack of energy, depression, anxiety Endocrine:  Negative for polydipsia, polyuria, symptoms of hypoglycemia (dizziness, hunger, sweating) Hematologic:  Negative for anemia, purpura, petechia, prolonged or excessive bleeding, use of anticoagulants  Allergic:  Negative for difficulty breathing or choking as a result of exposure to anything; no shellfish allergy; no allergic response (rash/itch) to materials, foods  Physical exam: BP (!) 148/84   Pulse (!) 55   Ht 5\' 6"  (1.676 m)   Wt 174 lb (78.9 kg)   BMI 28.08 kg/m  GENERAL APPEARANCE:  Well appearing, well developed, well nourished, NAD HEENT: Atraumatic, Normocephalic, oropharynx clear. NECK: Supple without lymphadenopathy or thyromegaly. LUNGS: Clear to auscultation bilaterally. HEART: Regular Rate and Rhythm without murmurs, gallops, or rubs. ABDOMEN: Soft, non-tender, No Masses. EXTREMITIES: Moves all extremities well.  Without clubbing, cyanosis, or edema. NEUROLOGIC:  Alert and oriented x 3, normal gait, CN II-XII grossly intact.  MENTAL STATUS:  Appropriate. BACK:  Non-tender to palpation.  No CVAT SKIN:  Warm, dry and intact.   GU: Penis:  circumcised Meatus: Normal Scrotum: normal, no masses Testis: normal without  masses bilateral Epididymis: normal Prostate: 50 gm, NT, no nodules Rectum: Normal tone,  no masses or tenderness   Results: U/A: dipstick negative

## 2022-05-08 ENCOUNTER — Ambulatory Visit (HOSPITAL_COMMUNITY)
Admission: RE | Admit: 2022-05-08 | Discharge: 2022-05-08 | Disposition: A | Discharge status: Home / Self Care | Payer: Medicare Other | Source: Ambulatory Visit | Attending: Urology | Admitting: Urology

## 2022-05-08 ENCOUNTER — Ambulatory Visit (HOSPITAL_BASED_OUTPATIENT_CLINIC_OR_DEPARTMENT_OTHER): Payer: Medicare Other | Admitting: Urology

## 2022-05-08 ENCOUNTER — Encounter: Payer: Self-pay | Admitting: Urology

## 2022-05-08 ENCOUNTER — Other Ambulatory Visit: Payer: Self-pay | Admitting: Urology

## 2022-05-08 ENCOUNTER — Encounter (HOSPITAL_COMMUNITY): Payer: Self-pay

## 2022-05-08 DIAGNOSIS — N4289 Other specified disorders of prostate: Secondary | ICD-10-CM | POA: Diagnosis not present

## 2022-05-08 DIAGNOSIS — N400 Enlarged prostate without lower urinary tract symptoms: Secondary | ICD-10-CM

## 2022-05-08 DIAGNOSIS — N429 Disorder of prostate, unspecified: Secondary | ICD-10-CM | POA: Insufficient documentation

## 2022-05-08 DIAGNOSIS — R972 Elevated prostate specific antigen [PSA]: Secondary | ICD-10-CM | POA: Diagnosis present

## 2022-05-08 MED ORDER — LIDOCAINE HCL (PF) 2 % IJ SOLN: 10.0000 mL | Freq: Once | INTRAMUSCULAR | Status: AC

## 2022-05-08 MED ORDER — LIDOCAINE HCL (PF) 1 % IJ SOLN
INTRAMUSCULAR | Status: AC
  Administered 2022-05-08: 2.1 mL
  Filled 2022-05-08: qty 5

## 2022-05-08 MED ORDER — LIDOCAINE HCL (PF) 2 % IJ SOLN
INTRAMUSCULAR | Status: AC
  Administered 2022-05-08: 10 mL
  Filled 2022-05-08: qty 10

## 2022-05-08 MED ORDER — CEFTRIAXONE SODIUM 1 G IJ SOLR
INTRAMUSCULAR | Status: AC
  Administered 2022-05-08: 1 g via INTRAMUSCULAR
  Filled 2022-05-08: qty 10

## 2022-05-08 MED ORDER — LIDOCAINE HCL (PF) 1 % IJ SOLN: 2.1000 mL | Freq: Once | INTRAMUSCULAR | Status: AC

## 2022-05-08 MED ORDER — CEFTRIAXONE SODIUM 1 G IJ SOLR: 1.0000 g | Freq: Once | INTRAMUSCULAR | Status: AC

## 2022-05-08 NOTE — Progress Notes (Signed)
Assessment: 1. Elevated PSA   2. BPH without urinary obstruction     Plan: Post biopsy instructions given Return to office in 7-10 for biopsy results  Chief Complaint:  Chief Complaint  Patient presents with   Prostate Biopsy    History of Present Illness:  Geoffrey Bishop is a 66 y.o. male who is seen for further evaluation of elevated PSA. PSA results: 3/21 4.7 10/23 6.6  No prior prostate biopsy.  No family history of prostate cancer.  No history of UTIs or prostatitis. He does not have any significant urinary symptoms other than a decreased stream at times.  No dysuria or gross hematuria. IPSS = 0 today. He does have a history of erectile dysfunction and takes Levitra as needed.  He presents today for a prostate ultrasound and biopsy.  Portions of the above documentation were copied from a prior visit for review purposes only.   Past Medical History:  Past Medical History:  Diagnosis Date   BPH (benign prostatic hyperplasia)     Past Surgical History:  Past Surgical History:  Procedure Laterality Date   EYE SURGERY  2022   JOINT REPLACEMENT  2019   left knee    Allergies:  No Known Allergies  Family History:  Family History  Problem Relation Age of Onset   Heart disease Mother    Lung cancer Brother     Social History:  Social History   Tobacco Use   Smoking status: Never   Smokeless tobacco: Never  Vaping Use   Vaping Use: Never used  Substance Use Topics   Alcohol use: Never   Drug use: Never    ROS: Constitutional:  Negative for fever, chills, weight loss CV: Negative for chest pain, previous MI, hypertension Respiratory:  Negative for shortness of breath, wheezing, sleep apnea, frequent cough GI:  Negative for nausea, vomiting, bloody stool, GERD  Physical exam: GENERAL APPEARANCE:  Well appearing, well developed, well nourished, NAD HEENT:  Atraumatic, normocephalic, oropharynx clear NECK:  Supple without lymphadenopathy or  thyromegaly ABDOMEN:  Soft, non-tender, no masses EXTREMITIES:  Moves all extremities well, without clubbing, cyanosis, or edema NEUROLOGIC:  Alert and oriented x 3, normal gait, CN II-XII grossly intact MENTAL STATUS:  appropriate BACK:  Non-tender to palpation, No CVAT SKIN:  Warm, dry, and intact   Results: None  TRANSRECTAL ULTRASOUND AND PROSTATE BIOPSY  Indication:  Elevated PSA  Prophylactic antibiotic administration: Rocephin  All medications that could result in increased bleeding were discontinued within an appropriate period of the time of biopsy.  Risk including bleeding and infection were discussed.  Informed consent was obtained.  The patient was placed in the left lateral decubitus position.  PROCEDURE 1.  TRANSRECTAL ULTRASOUND OF THE PROSTATE  The 7 MHz transrectal probe was used to image the prostate.  Anal stenosis was not noted.  TRUS volume: 40.09 ml  Hypoechoic areas: None  Hyperechoic areas: None  Central calcifications: not present  Margins:  normal   PROCEDURE 2:  PROSTATE BIOPSY  A periprostatic block was performed using 1% lidocaine and transrectal ultrasound guidance. Under transrectal ultrasound guidance, and using the Biopty gun, prostate biopsies were obtained systematically from the apex, mid gland, and base bilaterally.  A total of 12 cores were obtained.  Hemostasis was obtained with gentle pressure on the prostate.  The procedures were well-tolerated.  No significant bleeding was noted at the end of the procedure.  The patient was stable for discharge from the office.

## 2022-05-08 NOTE — Progress Notes (Signed)
PT tolerated prostate biopsy procedure and antibiotic injection well today. Labs obtained and sent for pathology. PT ambulatory at discharge with no acute distress noted and verbalized understanding of discharge instructions. PT to follow up with urologist as scheduled on 05/27/22 @3 :30pm.

## 2022-05-14 ENCOUNTER — Encounter: Payer: Self-pay | Admitting: Urology

## 2022-05-17 NOTE — Telephone Encounter (Signed)
Patient returned missed call for results.  Call back at [phone].  Thanks, Helene Kelp

## 2022-05-22 ENCOUNTER — Other Ambulatory Visit: Payer: Self-pay

## 2022-05-22 ENCOUNTER — Encounter: Payer: Self-pay | Admitting: Neurology

## 2022-05-22 DIAGNOSIS — R202 Paresthesia of skin: Secondary | ICD-10-CM

## 2022-05-22 NOTE — Progress Notes (Signed)
Initial neurology clinic note  SERVICE DATE: 05/23/22  Reason for Evaluation: Consultation requested by Janora Norlander, DO for an opinion regarding numbness and tingling in feet. My final recommendations will be communicated back to the requesting physician by way of shared medical record or letter to requesting physician via [place] mail.  HPI: This is Mr. Geoffrey Bishop, a 66 y.o. right-handed male with a medical history of BPH, GERD who presents to neurology clinic with the chief complaint of numbness and tingling in his feet. The patient is alone today.  Patient had left knee replacement about 4 years ago. He states that he was told he had a nerve cut. His left foot started feeling numb about 1 year ago. He then noticed symptoms in the right foot about 5 months ago. It is worse with activity. When he sits, symptoms improve. He does not have significant back pain radiating into legs. He denies weakness in his legs. He denies symptoms in either upper extremity.  Patient read about symptoms and started taking B12 a couple months ago (he thinks it was 500 mcg). He started a B complex about 1 week ago. This has helped. He missed one day and he had problems that day.  Patient has an EMG with me scheduled for 07/29/22 at 9 am.  The patient has not had similar episodes of symptoms in the past.   The patient does not report symptoms referable to autonomic dysfunction including impaired sweating, heat or cold intolerance, excessive mucosal dryness, gastroparetic early satiety, postprandial abdominal bloating, constipation, bowel or bladder dyscontrol, or syncope/presyncope/orthostatic intolerance.  The patient has not noticed any recent skin rashes nor does he report any constitutional symptoms like fever, night sweats, anorexia or unintentional weight loss.  EtOH use: None  Restrictive diet? No Family history of neuropathy/myopathy/NM disease? No  Patient is very active. He plays tennis 2  hours per week.   MEDICATIONS:  Outpatient Encounter Medications as of 05/23/2022  Medication Sig   cholecalciferol (VITAMIN D3) 25 MCG (1000 UNIT) tablet Take 1,000 Units by mouth daily.   lansoprazole (PREVACID) 30 MG capsule Take 1 capsule (30 mg total) by mouth daily as needed (GERD flareups (protonix not covered so this is a replacement)).   vardenafil (LEVITRA) 20 MG tablet Take 1 tablet (20 mg total) by mouth daily as needed for erectile dysfunction.   glucosamine-chondroitin 500-400 MG tablet Take 1 tablet by mouth 3 (three) times daily.   vitamin B-12 (CYANOCOBALAMIN) 100 MCG tablet Take 100 mcg by mouth daily. (Patient not taking: Reported on 05/23/2022)   No facility-administered encounter medications on file as of 05/23/2022.    PAST MEDICAL HISTORY: Past Medical History:  Diagnosis Date   BPH (benign prostatic hyperplasia)     PAST SURGICAL HISTORY: Past Surgical History:  Procedure Laterality Date   EYE SURGERY  2022   JOINT REPLACEMENT  2019   left knee    ALLERGIES: No Known Allergies  FAMILY HISTORY: Family History  Problem Relation Age of Onset   Heart disease Mother    Lung cancer Brother     SOCIAL HISTORY: Social History   Tobacco Use   Smoking status: Never   Smokeless tobacco: Never  Vaping Use   Vaping Use: Never used  Substance Use Topics   Alcohol use: Never   Drug use: Never   Social History   Social History Narrative   Very pleasant gentleman who retired from a Restaurant manager, fast food position with [employer].  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  They have 2 children, a son and a daughter who reside in [place].   Patient enjoys being physically active.  He tries to maintain health through proper diet, exercise and herbal remedies.   Are you right handed or left handed? Right   Are you currently employed ? retired   Caffeine 1 cup every other day   Do you live at home alone?with wife      What type of home do you live in: 1  story or 2 story? Two story         OBJECTIVE: PHYSICAL EXAM: BP (!) 153/84   Pulse 80   Ht 5\' 6"  (1.676 m)   Wt 175 lb (79.4 kg)   SpO2 98%   BMI 28.25 kg/m   Bishop: Bishop appearance: Awake and alert. No distress. Cooperative with exam.  Skin: No obvious rash or jaundice. HEENT: Atraumatic. Anicteric. Lungs: Non-labored breathing on room air  Extremities: No edema. No obvious deformity.  Musculoskeletal: No obvious joint swelling. Psych: Affect appropriate.  Neurological: Mental Status: Alert. Speech fluent. No pseudobulbar affect Cranial Nerves: CNII: No RAPD. Visual fields grossly intact. CNIII, IV, VI: PERRL. No nystagmus. EOMI. CN V: Facial sensation intact bilaterally to fine touch. CN VII: Facial muscles symmetric and strong. No ptosis at rest. CN VIII: Hearing grossly intact bilaterally. CN IX: No hypophonia. CN X: Palate elevates symmetrically. CN XI: Full strength shoulder shrug bilaterally. CN XII: Tongue protrusion full and midline. No atrophy or fasciculations. No significant dysarthria Motor: Tone is normal. No fasciculations in extremities. No atrophy.  Individual muscle group testing (MRC grade out of 5):  Movement     Neck flexion 5    Neck extension 5     Right Left   Shoulder abduction 5 5   Elbow flexion 5 5   Elbow extension 5 5   Finger abduction - FDI 5 5   Finger abduction - ADM 5 5   Finger extension 5 5   Finger distal flexion - 2/3 5 5    Finger distal flexion - 4/5 5 5    Thumb flexion - FPL 5 5    Hip flexion 5 5   Hip extension 5 5   Hip adduction 5 5   Hip abduction 5 5   Knee extension 5 5   Knee flexion 5 5   Dorsiflexion 5 5   Plantarflexion 5 5   Inversion 5 5   Eversion 5 5   Great toe extension 5 5   Great toe flexion 5 5     Reflexes:  Right Left   Bicep 2+ 2+   Tricep 2+ 2+   BrRad 2+ 2+   Knee 2+ 2+ Cross adductors bilaterally  Ankle 2+ 2+    Pathological Reflexes: Babinski: flexor response  bilaterally Hoffman: absent bilaterally Troemner: absent bilaterally Sensation: Pinprick: Intact in all extremities Vibration: 5-6 seconds in bilateral great toes. Intact in ankles and knees Proprioception: ?mildly diminished at bilateral great toes. Gets most right but some incorrect Coordination: Intact finger-to- nose-finger bilaterally. Romberg negative. Gait: Able to rise from chair with arms crossed unassisted. Normal, narrow-based gait. Able to tandem walk. Able to walk on toes and heels.  Lab and Test Review: Internal labs: B12 (04/26/22): 929 HbA1c (04/30/22): 5.6  ASSESSMENT: Geoffrey Bishop is a 66 y.o. male who presents for evaluation of numbness in bilateral feet (left greater than right). He has a relevant medical history of BPH, GERD. His neurological  examination is pertinent for diminished sensation to vibration in bilateral feet. Available diagnostic data is significant for normal B12 and HbA1c. 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 He currently has only numbness and no pain (tingling). We discussed that there were not treatment for numbness, but that if painful symptoms develop, we could discuss medications. I will work up potential neuropathy vs radiculopathy (less likely) as below.  PLAN: -Blood work: IFE -EMG: PN vs radiculopathy protocol (L > R)  -Return to clinic to be determined after testing is completed.  The impression above as well as the plan as outlined below were extensively discussed with the patient who voiced understanding. All questions were answered to their satisfaction  When available, results of the above investigations and possible further recommendations will be communicated to the patient via telephone/MyChart. Patient to call office if not contacted after expected testing turnaround time.   Total time spent reviewing records, interview,  history/exam, documentation, and coordination of care on day of encounter:  35 min   Thank you for allowing me to participate in patient's care.  If I can answer any additional questions, I would be pleased to do so.  Kai Levins, MD   CC: Janora Norlander, DO [place] [id number]  CC: Referring provider: Janora Norlander, DO [address] [place],  [place] [address]

## 2022-05-23 ENCOUNTER — Other Ambulatory Visit: Payer: Medicare Other

## 2022-05-23 ENCOUNTER — Ambulatory Visit: Payer: Medicare Other | Admitting: Neurology

## 2022-05-23 ENCOUNTER — Encounter: Payer: Self-pay | Admitting: Neurology

## 2022-05-23 VITALS — BP 153/84 | HR 80 | Ht 66.0 in | Wt 175.0 lb

## 2022-05-23 DIAGNOSIS — R209 Unspecified disturbances of skin sensation: Secondary | ICD-10-CM

## 2022-05-23 DIAGNOSIS — R200 Anesthesia of skin: Secondary | ICD-10-CM

## 2022-05-23 NOTE — Patient Instructions (Signed)
I saw you today for numbness in your feet. This could be neuropathy, but you do not have any known risk factors for neuropathy.  I would like to investigate further with blood work today.  I would like to get a muscle and nerve test called EMG (more information below).  When I have your results, I will be in touch and discuss next steps.  Please let me know if you have any questions or concerns in the meantime or have new or worsening symptoms.  The physicians and staff at [facility] are committed to providing excellent care. You may receive a survey requesting feedback about your experience at our office. We strive to receive "very good" responses to the survey questions. If you feel that your experience would prevent you from giving the office a "very good " response, please contact our office to try to remedy the situation. We may be reached at [phone]. Thank you for taking the time out of your busy day to complete the survey.  Jacquelyne Balint, MD [facility]  ELECTROMYOGRAM AND NERVE CONDUCTION STUDIES (EMG/NCS) INSTRUCTIONS  How to Prepare The neurologist conducting the EMG will need to know if you have certain medical conditions. Tell the neurologist and other EMG lab personnel if you: Have a pacemaker or any other electrical medical device Take blood-thinning medications Have hemophilia, a blood-clotting disorder that causes prolonged bleeding Bathing Take a shower or bath shortly before your exam in order to remove oils from your skin. Don't apply lotions or creams before the exam.  What to Expect You'll likely be asked to change into a hospital gown for the procedure and lie down on an examination table. The following explanations can help you understand what will happen during the exam.  Electrodes. The neurologist or a technician places surface electrodes at various locations on your skin depending on where you're experiencing symptoms. Or the neurologist may insert  needle electrodes at different sites depending on your symptoms.  Sensations. The electrodes will at times transmit a tiny electrical current that you may feel as a twinge or spasm. The needle electrode may cause discomfort or pain that usually ends shortly after the needle is removed. If you are concerned about discomfort or pain, you may want to talk to the neurologist about taking a short break during the exam.  Instructions. During the needle EMG, the neurologist will assess whether there is any spontaneous electrical activity when the muscle is at rest - activity that isn't present in healthy muscle tissue - and the degree of activity when you slightly contract the muscle.  He or she will give you instructions on resting and contracting a muscle at appropriate times. Depending on what muscles and nerves the neurologist is examining, he or she may ask you to change positions during the exam.  After your EMG You may experience some temporary, minor bruising where the needle electrode was inserted into your muscle. This bruising should fade within several days. If it persists, contact your primary care doctor.

## 2022-05-27 ENCOUNTER — Ambulatory Visit: Payer: Medicare Other | Admitting: Urology

## 2022-05-30 LAB — IMMUNOFIXATION ELECTROPHORESIS
IgG (Immunoglobin G), Serum: 1039 mg/dL (ref 600–1540)
IgM, Serum: 195 mg/dL (ref 50–300)
Immunoglobulin A: 159 mg/dL (ref 70–320)

## 2022-07-29 ENCOUNTER — Encounter: Payer: Medicare Other | Admitting: Neurology

## 2022-07-29 ENCOUNTER — Ambulatory Visit: Payer: Medicare Other | Admitting: Neurology

## 2022-07-29 ENCOUNTER — Telehealth: Payer: Self-pay | Admitting: Neurology

## 2022-07-29 DIAGNOSIS — R202 Paresthesia of skin: Secondary | ICD-10-CM

## 2022-07-29 DIAGNOSIS — M5417 Radiculopathy, lumbosacral region: Secondary | ICD-10-CM

## 2022-07-29 DIAGNOSIS — R200 Anesthesia of skin: Secondary | ICD-10-CM

## 2022-07-29 DIAGNOSIS — R209 Unspecified disturbances of skin sensation: Secondary | ICD-10-CM

## 2022-07-29 NOTE — Telephone Encounter (Signed)
Discussed the results of patient's EMG after the procedure today. 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 Patient is very strong and active, and would not need PT. We discussed MRI lumbar spine to further evaluate, but patient preferred to monitor symptoms.  All questions were answered.  Geoffrey Levins, MD [facility]

## 2022-07-29 NOTE — Procedures (Signed)
[facility]  [place], [address]  [place], [place] [address] Tel: [phone] Fax: [phone] Test Date:  07/29/2022  Patient: Geoffrey Bishop DOB: 1956-02-24 Physician: Kai Levins, MD  Sex: Male Height: 5\' 6"  Ref Phys: Kai Levins, MD  ID#: [id number]   Technician:    History: This is a 67 year old male with numbness and tingling in the feet.  NCV & EMG Findings: Extensive electrodiagnostic evaluation of the left lower limb with additional needle examination of the right lower limb shows: Left sural and medial plantar sensory responses are within normal limits. Left peroneal (EDB) and tibial (AH) sensory responses are within normal limits. Left H-reflex response shows a mildly prolonged latency (36.2 ms). Chronic motor axon loss changes without accompanying active denervation changes are seen in the left extensor digitorum brevis, left abductor hallucis, left medial head of gastrocnemius muscle, and bilateral short head of biceps femoris muscles.  Impression: This is an abnormal electrodiagnostic study. 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 No electrodiagnostic evidence of a large fiber sensorimotor polyneuropathy. Chronic (no active) motor axon loss changes are seen isolated to the intrinsic foot muscles. This finding in isolation is of unclear clinical significance and can be seen as a result of chronic repetitive local trauma from footwear, among other possibilities.    ___________________________ Kai Levins, MD    Nerve Conduction Studies Motor Nerve Results    Latency Amplitude F-Lat Segment Distance CV Comment  Site (ms) Norm (mV) Norm (ms)  (cm) (m/s) Norm   Left Fibular (EDB) Motor  Ankle 3.9  < 6.0 4.0  > 2.5        Bel fib head 11.1 - 3.5 -  Bel fib head-Ankle 31 43  > 40   Pop fossa 13.0 - 3.3 -  Pop  fossa-Bel fib head 9 47 -   Left Tibial (AH) Motor  Ankle 5.9  < 6.0 5.1  > 4.0        Knee 15.0 - 3.9 -  Knee-Ankle 40 44  > 40    Sensory Sites    Neg Peak Lat Amplitude (O-P) Segment Distance Velocity Comment  Site (ms) Norm (V) Norm  (cm) (ms)   Left Medial Plantar (Ortho) Sensory  Great toe-Med mall 3.7 - 6 - Great toe-Med mall -    Left Sural Sensory  Calf-Lat mall 3.8  < 4.6 12  > 3 Calf-Lat mall 14     H-Reflex Results    M-Lat H Lat H Neg Amp H-M Lat  Site (ms) (ms) Norm (mV) (ms)  Left Tibial H-Reflex  Pop fossa 7.5 *36.2  < 35.0 0.95 28.7   Electromyography   Side Muscle Ins.Act Fibs Fasc Recrt Amp Dur Poly Activation Comment  Left Tib ant Nml Nml Nml Nml Nml Nml Nml Nml N/A  Left Gastroc MH Nml Nml Nml *1- *1+ *1+ Nml Nml N/A  Left EDB Nml Nml Nml *1- *1+ *1+ *1+ Nml N/A  Left AH Nml Nml Nml *2- *1+ *1+ *1+ Nml N/A  Left Rectus fem Nml Nml Nml Nml Nml Nml Nml Nml N/A  Left Biceps fem SH Nml Nml Nml *2- *1+ *1+ *1+ Nml N/A  Left Gluteus med Nml Nml Nml Nml Nml Nml Nml Nml N/A  Left L5 PSP Nml Nml Nml Nml Nml Nml Nml Nml N/A  Right Biceps fem SH Nml  Nml Nml *2- *1+ *1+ *1+ Nml N/A      Waveforms:  Motor      Sensory      H-Reflex

## 2022-09-16 ENCOUNTER — Ambulatory Visit: Payer: Medicare Other | Admitting: Urology

## 2022-10-07 DIAGNOSIS — Z961 Presence of intraocular lens: Secondary | ICD-10-CM | POA: Diagnosis not present

## 2022-10-07 DIAGNOSIS — H26491 Other secondary cataract, right eye: Secondary | ICD-10-CM | POA: Diagnosis not present

## 2022-10-07 DIAGNOSIS — H40023 Open angle with borderline findings, high risk, bilateral: Secondary | ICD-10-CM | POA: Diagnosis not present

## 2022-10-07 DIAGNOSIS — H25812 Combined forms of age-related cataract, left eye: Secondary | ICD-10-CM | POA: Diagnosis not present

## 2022-12-09 IMAGING — DX DG CHEST 2V
2 series · 2 of 2 positions shown · non-contrast
Comparison: None.

CLINICAL DATA: Cough

EXAM:
CHEST - 2 VIEW

[chest pa]
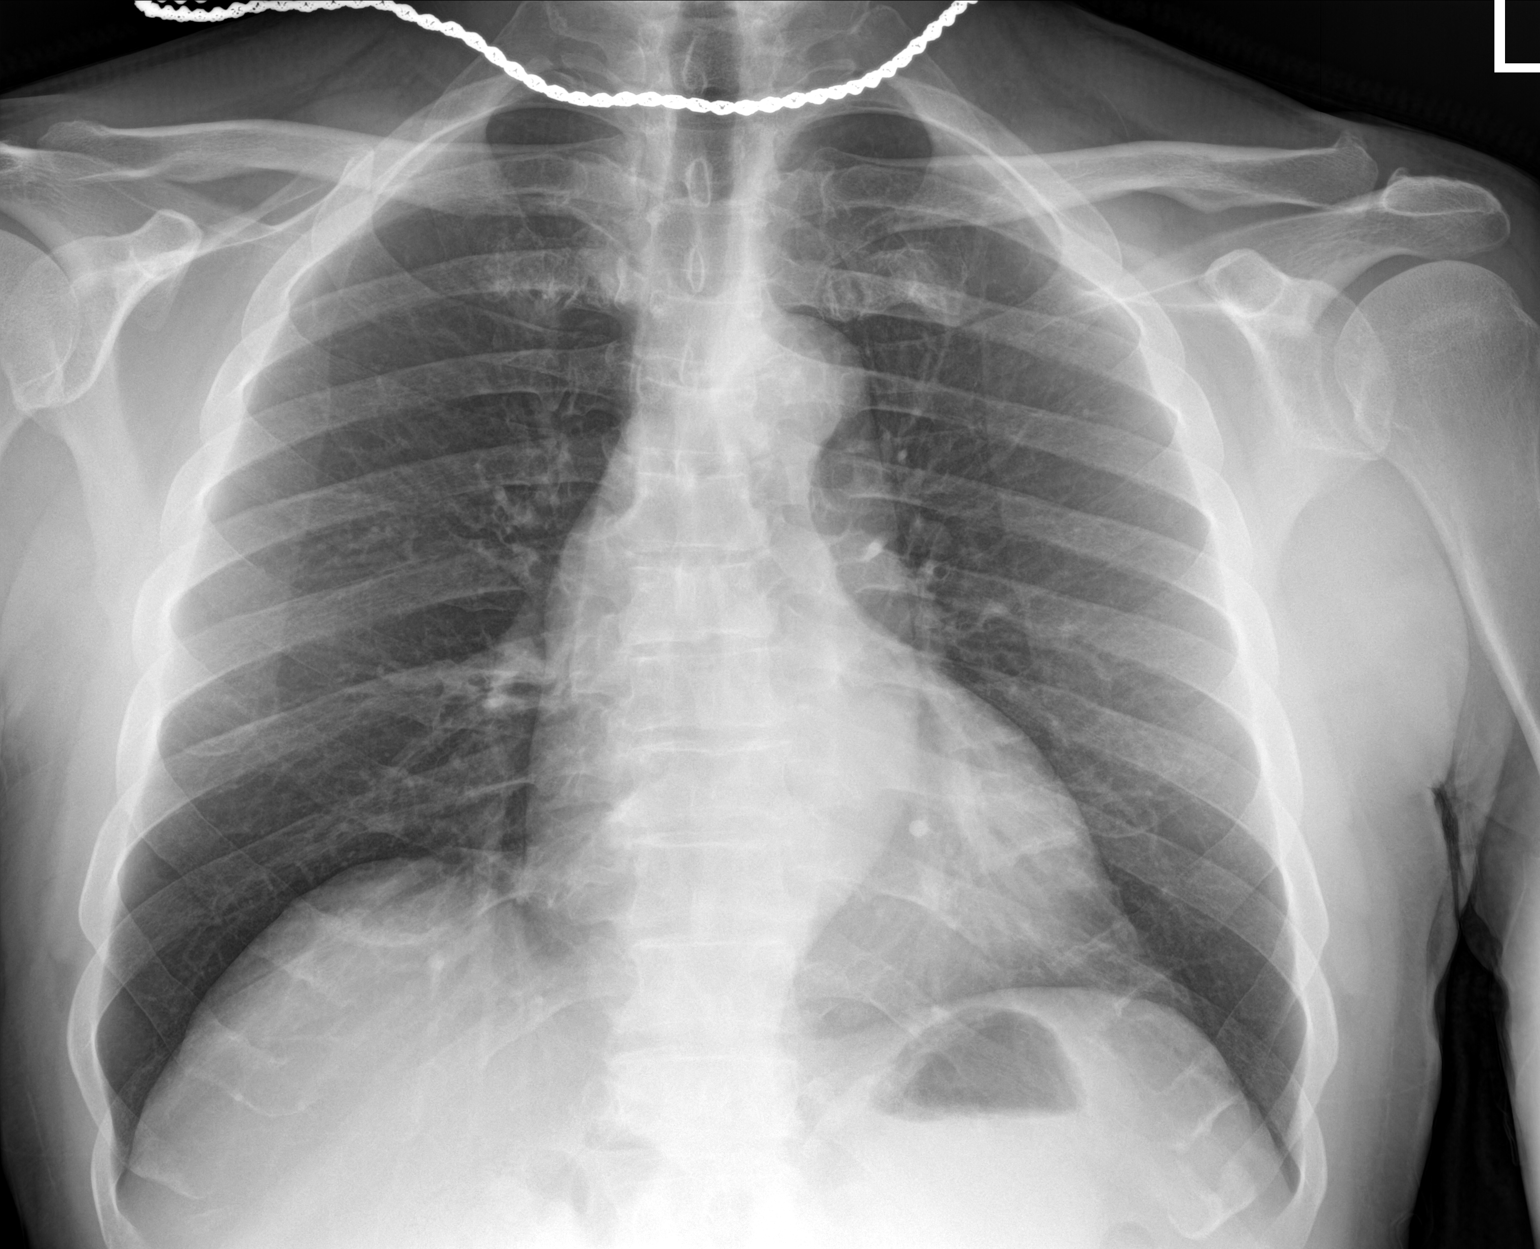

[chest lat]
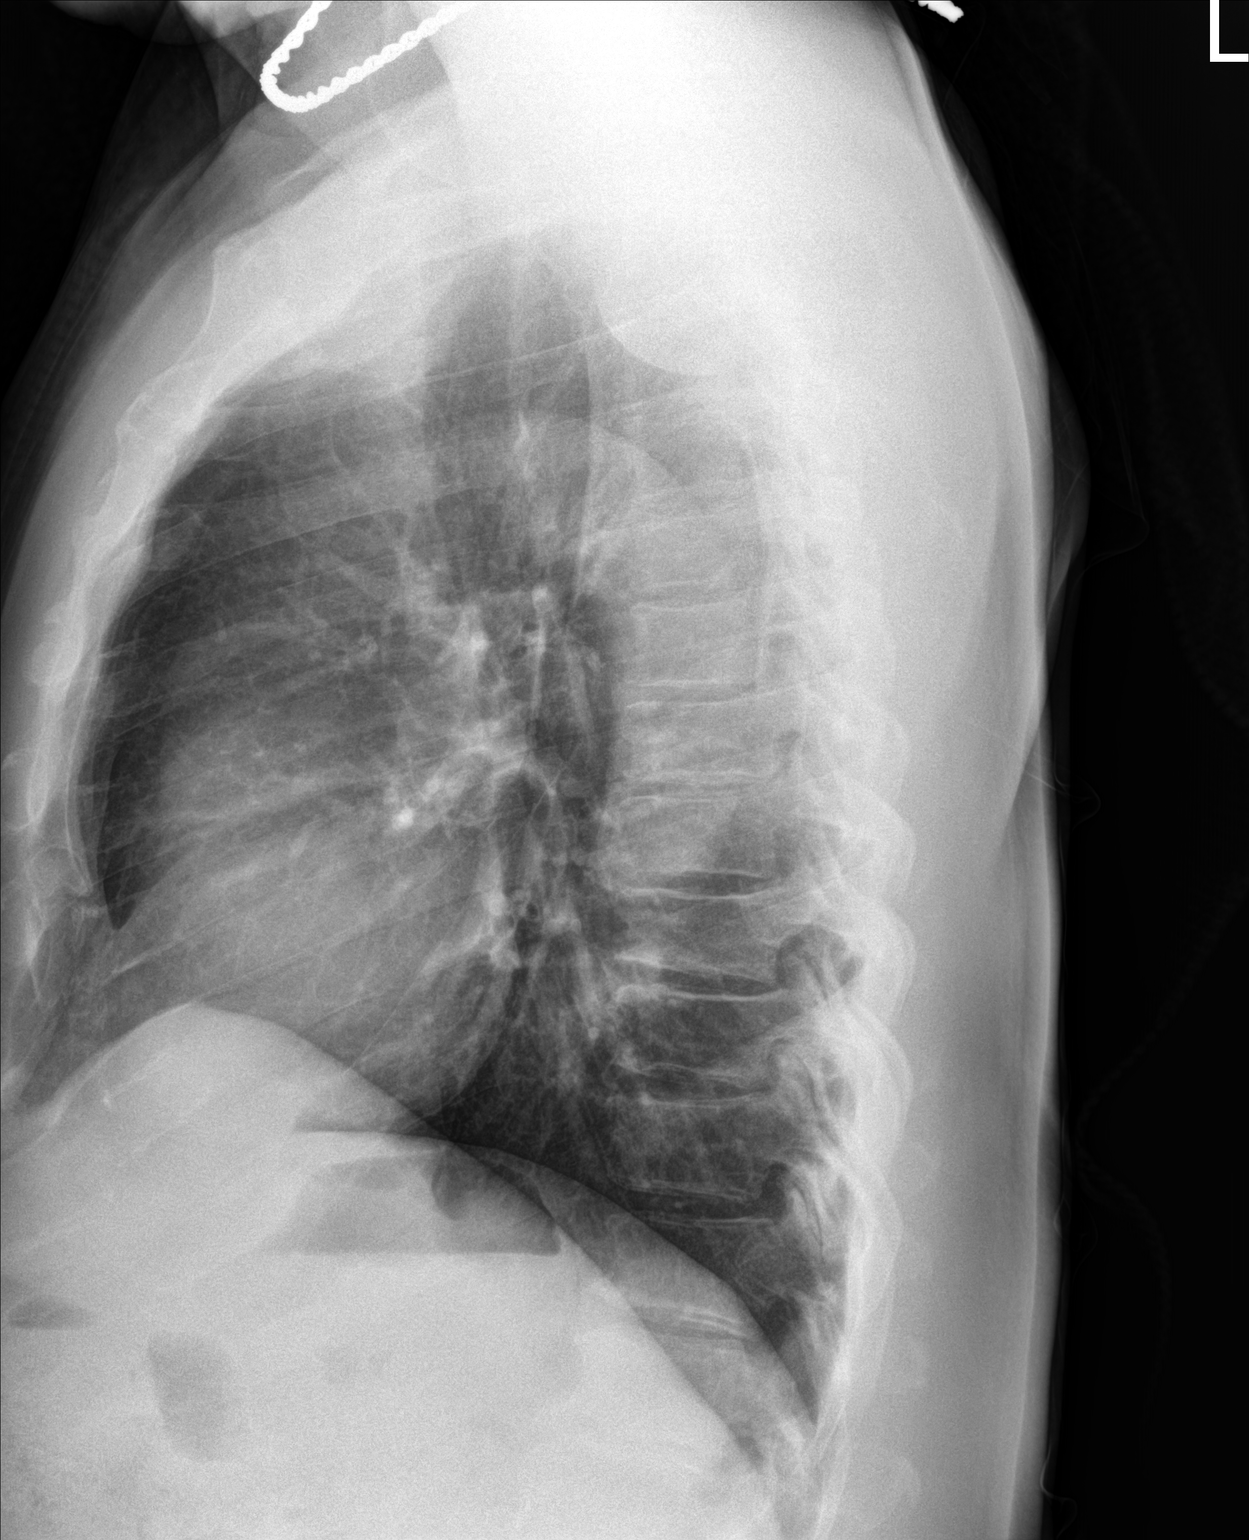

[2 of 2 positions shown; findings below may reference images not displayed]

FINDINGS: The heart size and mediastinal contours are within normal limits.
Both lungs are clear. The visualized skeletal structures are
unremarkable.
IMPRESSION: No acute abnormality of the lungs.

## 2023-02-17 ENCOUNTER — Ambulatory Visit: Payer: Medicare Other

## 2023-02-27 ENCOUNTER — Ambulatory Visit (INDEPENDENT_AMBULATORY_CARE_PROVIDER_SITE_OTHER): Payer: Medicare Other

## 2023-02-27 VITALS — Ht 66.0 in | Wt 172.0 lb

## 2023-02-27 DIAGNOSIS — Z0000 Encounter for general adult medical examination without abnormal findings: Secondary | ICD-10-CM

## 2023-02-27 NOTE — Progress Notes (Signed)
Subjective:   Geoffrey Bishop is a 67 y.o. male who presents for an Initial Medicare Annual Wellness Visit.  Visit Complete: Virtual  I connected with  Geoffrey Bishop on 02/27/23 by a audio enabled telemedicine application and verified that I am speaking with the correct person using two identifiers.  Patient Location: Home  Provider Location: Home Office  I discussed the limitations of evaluation and management by telemedicine. The patient expressed understanding and agreed to proceed.  Patient Medicare AWV questionnaire was completed by the patient on 02/27/2023; I have confirmed that all information answered by patient is correct and no changes since this date.  Review of Systems    Vital Signs: Unable to obtain new vitals due to this being a telehealth visit.  Cardiac Risk Factors include: advanced age (>38men, >4 women);male gender     Objective:    Today's Vitals   02/27/23 1033  Weight: 172 lb (78 kg)  Height: 5\' 6"  (1.676 m)   Body mass index is 27.76 kg/m.     02/27/2023   10:36 AM 05/23/2022    1:33 PM  Advanced Directives  Does Patient Have a Medical Advance Directive? Yes Yes  Type of Estate agent of [place];Living will Living will;Healthcare Power of Attorney  Copy of Healthcare Power of Attorney in Chart? No - copy requested     Current Medications (verified) Outpatient Encounter Medications as of 02/27/2023  Medication Sig   cholecalciferol (VITAMIN D3) 25 MCG (1000 UNIT) tablet Take 1,000 Units by mouth daily.   lansoprazole (PREVACID) 30 MG capsule Take 1 capsule (30 mg total) by mouth daily as needed (GERD flareups (protonix not covered so this is a replacement)).   NON FORMULARY 1 tablet daily   vardenafil (LEVITRA) 20 MG tablet Take 1 tablet (20 mg total) by mouth daily as needed for erectile dysfunction.   vitamin B-12 (CYANOCOBALAMIN) 100 MCG tablet Take 100 mcg by mouth daily.   No facility-administered encounter  medications on file as of 02/27/2023.    Allergies (verified) Patient has no known allergies.   History: Past Medical History:  Diagnosis Date   BPH (benign prostatic hyperplasia)    Past Surgical History:  Procedure Laterality Date   EYE SURGERY  2022   JOINT REPLACEMENT  2019   left knee   Family History  Problem Relation Age of Onset   Heart disease Mother    Lung cancer Brother    Social History   Socioeconomic History   Marital status: Married    Spouse name: Geoffrey Bishop   Number of children: 2   Years of education: Not on file   Highest education level: Not on file  Occupational History   Occupation: retired     Comment: used to be a Production designer, theatre/television/film at [employer]  Tobacco Use   Smoking status: Never   Smokeless tobacco: Never  Vaping Use   Vaping status: Never Used  Substance and Sexual Activity   Alcohol use: Never   Drug use: Never   Sexual activity: Not on file  Other Topics Concern   Not on file  Social History Narrative   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  They have 2 children, a son and a daughter who reside in [place].   Patient enjoys being physically active.  He tries to maintain health through proper diet, exercise and herbal remedies.   Are you right handed or  left handed? Right   Are you currently employed ? retired   Caffeine 1 cup every other day   Do you live at home alone?with wife      What type of home do you live in: 1 story or 2 story? Two story        Social Determinants of Health   Financial Resource Strain: Low Risk  (02/27/2023)   Overall Financial Resource Strain (CARDIA)    Difficulty of Paying Living Expenses: Not hard at all  Food Insecurity: No Food Insecurity (02/27/2023)   Hunger Vital Sign    Worried About Running Out of Food in the Last Year: Never true    Ran Out of Food in the Last Year: Never true  Transportation Needs: No  Transportation Needs (02/27/2023)   PRAPARE - Administrator, Civil Service (Medical): No    Lack of Transportation (Non-Medical): No  Physical Activity: Sufficiently Active (02/27/2023)   Exercise Vital Sign    Days of Exercise per Week: 7 days    Minutes of Exercise per Session: 60 min  Stress: No Stress Concern Present (02/27/2023)   [employer] of Occupational Health - Occupational Stress Questionnaire    Feeling of Stress : Not at all  Social Connections: Moderately Integrated (02/27/2023)   Social Connection and Isolation Panel [NHANES]    Frequency of Communication with Friends and Family: More than three times a week    Frequency of Social Gatherings with Friends and Family: More than three times a week    Attends Religious Services: More than 4 times per year    Active Member of [employer] or Organizations: No    Attends Engineer, structural: Never    Marital Status: Married    Tobacco Counseling Counseling given: Not Answered   Clinical Intake:  Pre-visit preparation completed: Yes  Pain : No/denies pain     Nutritional Risks: None Diabetes: No  How often do you need to have someone help you when you read instructions, pamphlets, or other written materials from your doctor or pharmacy?: 1 - Never  Interpreter Needed?: No  Information entered by :: Renie Ora, LPN   Activities of Daily Living    02/27/2023   10:37 AM  In your present state of health, do you have any difficulty performing the following activities:  Hearing? 0  Vision? 0  Difficulty concentrating or making decisions? 0  Walking or climbing stairs? 0  Dressing or bathing? 0  Doing errands, shopping? 0  Preparing Food and eating ? N  Using the Toilet? N  In the past six months, have you accidently leaked urine? N  Do you have problems with loss of bowel control? N  Managing your Medications? N  Managing your Finances? N  Housekeeping or managing your Housekeeping? N     Patient Care Team: Raliegh Ip, DO as PCP - General (Family Medicine)  Indicate any recent Medical Services you may have received from other than [facility] providers in the past year (date may be approximate).     Assessment:   This is a routine wellness examination for Geoffrey Bishop.  Hearing/Vision screen Vision Screening - Comments:: Wears rx glasses - up to date with routine eye exams with  Dr.  Lois Huxley issues and exercise activities discussed:     Goals Addressed             This Visit's Progress    Patient Stated       Maintain current health  Depression Screen    02/27/2023   10:35 AM 04/26/2022   10:23 AM 06/27/2021    4:24 PM 01/30/2021    3:17 PM 10/06/2019    2:01 PM  PHQ 2/9 Scores  PHQ - 2 Score 0 0 0 0 0  PHQ- 9 Score     0    Fall Risk    02/27/2023   10:33 AM 05/23/2022    1:33 PM 04/26/2022   10:23 AM 06/27/2021    4:24 PM 01/30/2021    3:17 PM  Fall Risk   Falls in the past year? 0 0 0 0 0  Number falls in past yr: 0 0     Injury with Fall? 0 0     Risk for fall due to : No Fall Risks      Follow up Falls prevention discussed Falls evaluation completed       MEDICARE RISK AT HOME:  Medicare Risk at Home - 02/27/23 1034     Any stairs in or around the home? Yes    If so, are there any without handrails? No    Home free of loose throw rugs in walkways, pet beds, electrical cords, etc? Yes    Adequate lighting in your home to reduce risk of falls? Yes    Life alert? No    Use of a cane, walker or w/c? No    Grab bars in the bathroom? Yes    Shower chair or bench in shower? Yes    Elevated toilet seat or a handicapped toilet? Yes             TIMED UP AND GO:  Was the test performed? No    Cognitive Function:        02/27/2023   10:37 AM  6CIT Screen  What Year? 0 points  What month? 0 points  What time? 0 points  Count back from 20 0 points  Months in reverse 0 points  Repeat phrase 0 points  Total Score 0  points    Immunizations Immunization History  Administered Date(s) Administered   Moderna Sars-Covid-2 Vaccination 09/22/2019, 10/20/2019, 05/10/2020   Td 04/26/2022   Tdap 07/15/1997, 08/31/2007   Zoster Recombinant(Shingrix) 05/16/2021, 08/08/2021    TDAP status: Up to date  Flu Vaccine status: Declined, Education has been provided regarding the importance of this vaccine but patient still declined. Advised may receive this vaccine at local pharmacy or Health Dept. Aware to provide a copy of the vaccination record if obtained from local pharmacy or Health Dept. Verbalized acceptance and understanding.  Pneumococcal vaccine status: Declined,  Education has been provided regarding the importance of this vaccine but patient still declined. Advised may receive this vaccine at local pharmacy or Health Dept. Aware to provide a copy of the vaccination record if obtained from local pharmacy or Health Dept. Verbalized acceptance and understanding.   Covid-19 vaccine status: Completed vaccines  Qualifies for Shingles Vaccine? Yes   Zostavax completed Yes   Shingrix Completed?: Yes  Screening Tests Health Maintenance  Topic Date Due   Medicare Annual Wellness (AWV)  Never done   COVID-19 Vaccine (4 - 2023-24 season) 03/15/2022   INFLUENZA VACCINE  02/13/2023   HIV Screening  04/27/2023 (Originally 03/07/1971)   Colonoscopy  01/24/2025   DTaP/Tdap/Td (4 - Td or Tdap) 04/26/2032   Hepatitis C Screening  Completed   Zoster Vaccines- Shingrix  Completed   HPV VACCINES  Aged Out   Pneumonia Vaccine 59+ Years old  Discontinued    Health Maintenance  Health Maintenance Due  Topic Date Due   Medicare Annual Wellness (AWV)  Never done   COVID-19 Vaccine (4 - 2023-24 season) 03/15/2022   INFLUENZA VACCINE  02/13/2023    Colorectal cancer screening: Type of screening: Colonoscopy. Completed 01/25/2015. Repeat every 10 years  Lung Cancer Screening: (Low Dose CT Chest recommended if Age  29-80 years, 20 pack-year currently smoking OR have quit w/in 15years.) does not qualify.   Lung Cancer Screening Referral: n/a  Additional Screening:  Hepatitis C Screening: does not qualify; Completed 09/05/2014  Vision Screening: Recommended annual ophthalmology exams for early detection of glaucoma and other disorders of the eye. Is the patient up to date with their annual eye exam?  Yes  Who is the provider or what is the name of the office in which the patient attends annual eye exams? Dr.Moya  If pt is not established with a provider, would they like to be referred to a provider to establish care? No .   Dental Screening: Recommended annual dental exams for proper oral hygiene   Community Resource Referral / Chronic Care Management: CRR required this visit?  No   CCM required this visit?  No    Plan:     I have personally reviewed and noted the following in the patient's chart:   Medical and social history Use of alcohol, tobacco or illicit drugs  Current medications and supplements including opioid prescriptions. Patient is not currently taking opioid prescriptions. Functional ability and status Nutritional status Physical activity Advanced directives List of other physicians Hospitalizations, surgeries, and ER visits in previous 12 months Vitals Screenings to include cognitive, depression, and falls Referrals and appointments  In addition, I have reviewed and discussed with patient certain preventive protocols, quality metrics, and best practice recommendations. A written personalized care plan for preventive services as well as general preventive health recommendations were provided to patient.     Lorrene Reid, LPN   [id number]/[id number]   After Visit Summary: (MyChart) Due to this being a telephonic visit, the after visit summary with patients personalized plan was offered to patient via MyChart   Nurse Notes: none

## 2023-02-27 NOTE — Patient Instructions (Signed)
Geoffrey Bishop , Thank you for taking time to come for your Medicare Wellness Visit. I appreciate your ongoing commitment to your health goals. Please review the following plan we discussed and let me know if I can assist you in the future.   Referrals/Orders/Follow-Ups/Clinician Recommendations: Aim for 30 minutes of exercise or brisk walking, 6-8 glasses of water, and 5 servings of fruits and vegetables each day.   This is a list of the screening recommended for you and due dates:  Health Maintenance  Topic Date Due   Medicare Annual Wellness Visit  Never done   COVID-19 Vaccine (4 - 2023-24 season) 03/15/2022   Flu Shot  02/13/2023   HIV Screening  04/27/2023*   Colon Cancer Screening  01/24/2025   DTaP/Tdap/Td vaccine (4 - Td or Tdap) 04/26/2032   Hepatitis C Screening  Completed   Zoster (Shingles) Vaccine  Completed   HPV Vaccine  Aged Out   Pneumonia Vaccine  Discontinued  *Topic was postponed. The date shown is not the original due date.    Advanced directives: (Copy Requested) Please bring a copy of your health care power of attorney and living will to the office to be added to your chart at your convenience.  Next Medicare Annual Wellness Visit scheduled for next year: Yes  Preventive Care 22 Years and Older, Male  Preventive care refers to lifestyle choices and visits with your health care provider that can promote health and wellness. What does preventive care include? A yearly physical exam. This is also called an annual well check. Dental exams once or twice a year. Routine eye exams. Ask your health care provider how often you should have your eyes checked. Personal lifestyle choices, including: Daily care of your teeth and gums. Regular physical activity. Eating a healthy diet. Avoiding tobacco and drug use. Limiting alcohol use. Practicing safe sex. Taking low doses of aspirin every day. Taking vitamin and mineral supplements as recommended by your health care  provider. What happens during an annual well check? The services and screenings done by your health care provider during your annual well check will depend on your age, overall health, lifestyle risk factors, and family history of disease. Counseling  Your health care provider may ask you questions about your: Alcohol use. Tobacco use. Drug use. Emotional well-being. Home and relationship well-being. Sexual activity. Eating habits. History of falls. Memory and ability to understand (cognition). Work and work Astronomer. Screening  You may have the following tests or measurements: Height, weight, and BMI. Blood pressure. Lipid and cholesterol levels. These may be checked every 5 years, or more frequently if you are over 71 years old. Skin check. Lung cancer screening. You may have this screening every year starting at age 68 if you have a 30-pack-year history of smoking and currently smoke or have quit within the past 15 years. Fecal occult blood test (FOBT) of the stool. You may have this test every year starting at age 8. Flexible sigmoidoscopy or colonoscopy. You may have a sigmoidoscopy every 5 years or a colonoscopy every 10 years starting at age 8. Prostate cancer screening. Recommendations will vary depending on your family history and other risks. Hepatitis C blood test. Hepatitis B blood test. Sexually transmitted disease (STD) testing. Diabetes screening. This is done by checking your blood sugar (glucose) after you have not eaten for a while (fasting). You may have this done every 1-3 years. Abdominal aortic aneurysm (AAA) screening. You may need this if you are a current or  former smoker. Osteoporosis. You may be screened starting at age 36 if you are at high risk. Talk with your health care provider about your test results, treatment options, and if necessary, the need for more tests. Vaccines  Your health care provider may recommend certain vaccines, such  as: Influenza vaccine. This is recommended every year. Tetanus, diphtheria, and acellular pertussis (Tdap, Td) vaccine. You may need a Td booster every 10 years. Zoster vaccine. You may need this after age 35. Pneumococcal 13-valent conjugate (PCV13) vaccine. One dose is recommended after age 37. Pneumococcal polysaccharide (PPSV23) vaccine. One dose is recommended after age 74. Talk to your health care provider about which screenings and vaccines you need and how often you need them. This information is not intended to replace advice given to you by your health care provider. Make sure you discuss any questions you have with your health care provider. Document Released: 07/28/2015 Document Revised: 03/20/2016 Document Reviewed: 05/02/2015 Elsevier Interactive Patient Education  2017 [employer].  Fall Prevention in the Home Falls can cause injuries. They can happen to people of all ages. There are many things you can do to make your home safe and to help prevent falls. What can I do on the outside of my home? Regularly fix the edges of walkways and driveways and fix any cracks. Remove anything that might make you trip as you walk through a door, such as a raised step or threshold. Trim any bushes or trees on the path to your home. Use bright outdoor lighting. Clear any walking paths of anything that might make someone trip, such as rocks or tools. Regularly check to see if handrails are loose or broken. Make sure that both sides of any steps have handrails. Any raised decks and porches should have guardrails on the edges. Have any leaves, snow, or ice cleared regularly. Use sand or salt on walking paths during winter. Clean up any spills in your garage right away. This includes oil or grease spills. What can I do in the bathroom? Use night lights. Install grab bars by the toilet and in the tub and shower. Do not use towel bars as grab bars. Use non-skid mats or decals in the tub or  shower. If you need to sit down in the shower, use a plastic, non-slip stool. Keep the floor dry. Clean up any water that spills on the floor as soon as it happens. Remove soap buildup in the tub or shower regularly. Attach bath mats securely with double-sided non-slip rug tape. Do not have throw rugs and other things on the floor that can make you trip. What can I do in the bedroom? Use night lights. Make sure that you have a light by your bed that is easy to reach. Do not use any sheets or blankets that are too big for your bed. They should not hang down onto the floor. Have a firm chair that has side arms. You can use this for support while you get dressed. Do not have throw rugs and other things on the floor that can make you trip. What can I do in the kitchen? Clean up any spills right away. Avoid walking on wet floors. Keep items that you use a lot in easy-to-reach places. If you need to reach something above you, use a strong step stool that has a grab bar. Keep electrical cords out of the way. Do not use floor polish or wax that makes floors slippery. If you must use wax, use  non-skid floor wax. Do not have throw rugs and other things on the floor that can make you trip. What can I do with my stairs? Do not leave any items on the stairs. Make sure that there are handrails on both sides of the stairs and use them. Fix handrails that are broken or loose. Make sure that handrails are as long as the stairways. Check any carpeting to make sure that it is firmly attached to the stairs. Fix any carpet that is loose or worn. Avoid having throw rugs at the top or bottom of the stairs. If you do have throw rugs, attach them to the floor with carpet tape. Make sure that you have a light switch at the top of the stairs and the bottom of the stairs. If you do not have them, ask someone to add them for you. What else can I do to help prevent falls? Wear shoes that: Do not have high heels. Have  rubber bottoms. Are comfortable and fit you well. Are closed at the toe. Do not wear sandals. If you use a stepladder: Make sure that it is fully opened. Do not climb a closed stepladder. Make sure that both sides of the stepladder are locked into place. Ask someone to hold it for you, if possible. Clearly mark and make sure that you can see: Any grab bars or handrails. First and last steps. Where the edge of each step is. Use tools that help you move around (mobility aids) if they are needed. These include: Canes. Walkers. Scooters. Crutches. Turn on the lights when you go into a dark area. Replace any light bulbs as soon as they burn out. Set up your furniture so you have a clear path. Avoid moving your furniture around. If any of your floors are uneven, fix them. If there are any pets around you, be aware of where they are. Review your medicines with your doctor. Some medicines can make you feel dizzy. This can increase your chance of falling. Ask your doctor what other things that you can do to help prevent falls. This information is not intended to replace advice given to you by your health care provider. Make sure you discuss any questions you have with your health care provider. Document Released: 04/27/2009 Document Revised: 12/07/2015 Document Reviewed: 08/05/2014 Elsevier Interactive Patient Education  2017 [employer].

## 2023-04-21 ENCOUNTER — Encounter: Payer: Medicare Other | Admitting: Family Medicine

## 2023-06-16 ENCOUNTER — Encounter: Payer: Self-pay | Admitting: Family Medicine

## 2023-06-16 NOTE — Telephone Encounter (Signed)
Geoffrey Bishop, please offer a visit to be seen tomorrow with me.

## 2023-06-17 ENCOUNTER — Encounter: Payer: Self-pay | Admitting: Family Medicine

## 2023-06-17 ENCOUNTER — Ambulatory Visit (INDEPENDENT_AMBULATORY_CARE_PROVIDER_SITE_OTHER): Payer: Medicare Other | Admitting: Family Medicine

## 2023-06-17 ENCOUNTER — Other Ambulatory Visit: Payer: Self-pay

## 2023-06-17 VITALS — BP 133/75 | HR 54 | Temp 98.7°F | Ht 66.0 in | Wt 178.0 lb

## 2023-06-17 DIAGNOSIS — N63 Unspecified lump in unspecified breast: Secondary | ICD-10-CM | POA: Diagnosis not present

## 2023-06-17 NOTE — Progress Notes (Signed)
Subjective: CC: Chest mass PCP: Raliegh Ip, DO [id number] Geoffrey Bishop is a 67 y.o. male presenting to clinic today for:  1.  Chest mass Patient reports that last week he noticed a lump on the right side of his chest.  His wife is undergoing treatment for breast cancer and they happen to be at her oncologist office, who took a look at it.  She actually appreciated a second mass but patient has not been able to palpate that.  He does not report any unplanned weight loss, night sweats or lymphadenopathy.  Here for further evaluation   ROS: Per HPI  No Known Allergies Past Medical History:  Diagnosis Date   BPH (benign prostatic hyperplasia)     Current Outpatient Medications:    cholecalciferol (VITAMIN D3) 25 MCG (1000 UNIT) tablet, Take 1,000 Units by mouth daily., Disp: , Rfl:    lansoprazole (PREVACID) 30 MG capsule, Take 1 capsule (30 mg total) by mouth daily as needed (GERD flareups (protonix not covered so this is a replacement))., Disp: 90 capsule, Rfl: 0   NON FORMULARY, 1 tablet daily, Disp: , Rfl:    vardenafil (LEVITRA) 20 MG tablet, Take 1 tablet (20 mg total) by mouth daily as needed for erectile dysfunction., Disp: 10 tablet, Rfl: 0   vitamin B-12 (CYANOCOBALAMIN) 100 MCG tablet, Take 100 mcg by mouth daily., Disp: , Rfl:  Social History   Socioeconomic History   Marital status: Married    Spouse name: Geoffrey Bishop   Number of children: 2   Years of education: Not on file   Highest education level: Not on file  Occupational History   Occupation: retired     Comment: used to be a Production designer, theatre/television/film at [employer]  Tobacco Use   Smoking status: Never   Smokeless tobacco: Never  Vaping Use   Vaping status: Never Used  Substance and Sexual Activity   Alcohol use: Never   Drug use: Never   Sexual activity: Not on file  Other Topics Concern   Not on file  Social History Narrative   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  They have 2 children, a son and a daughter who reside in [place].   Patient enjoys being physically active.  He tries to maintain health through proper diet, exercise and herbal remedies.   Are you right handed or left handed? Right   Are you currently employed ? retired   Caffeine 1 cup every other day   Do you live at home alone?with wife      What type of home do you live in: 1 story or 2 story? Two story        Social Determinants of Health   Financial Resource Strain: Low Risk  (02/27/2023)   Overall Financial Resource Strain (CARDIA)    Difficulty of Paying Living Expenses: Not hard at all  Food Insecurity: No Food Insecurity (02/27/2023)   Hunger Vital Sign    Worried About Running Out of Food in the Last Year: Never true    Ran Out of Food in the Last Year: Never true  Transportation Needs: No Transportation Needs (02/27/2023)   PRAPARE - Administrator, Civil Service (Medical): No    Lack of Transportation (Non-Medical): No  Physical Activity: Sufficiently Active (02/27/2023)   Exercise Vital Sign    Days of Exercise per Week: 7 days    Minutes of  Exercise per Session: 60 min  Stress: No Stress Concern Present (02/27/2023)   [employer] of Occupational Health - Occupational Stress Questionnaire    Feeling of Stress : Not at all  Social Connections: Moderately Integrated (02/27/2023)   Social Connection and Isolation Panel [NHANES]    Frequency of Communication with Friends and Family: More than three times a week    Frequency of Social Gatherings with Friends and Family: More than three times a week    Attends Religious Services: More than 4 times per year    Active Member of [employer] or Organizations: No    Attends Banker Meetings: Never    Marital Status: Married  Catering manager Violence: Not At Risk (02/27/2023)   Humiliation, Afraid, Rape, and Kick questionnaire    Fear of Current or  Ex-Partner: No    Emotionally Abused: No    Physically Abused: No    Sexually Abused: No   Family History  Problem Relation Age of Onset   Heart disease Mother    Lung cancer Brother     Objective: Office vital signs reviewed. BP 133/75   Pulse (!) 54   Temp 98.7 F (37.1 C)   Ht 5\' 6"  (1.676 m)   Wt 178 lb (80.7 kg)   SpO2 97%   BMI 28.73 kg/m   Physical Examination:  General: Awake, alert, well nourished, No acute distress Chest: 7mm well circumscribed, mobile, oblong rubbery mass in right outter lower quadrant of breast. No LAD appreciated. Nontender.  Assessment/ Plan: 67 y.o. male   Breast mass in male - Plan: MM 3D DIAGNOSTIC MAMMOGRAM BILATERAL BREAST, US BREAST COMPLETE UNI RIGHT INC AXILLA  Feels cystic but since new, will eval with [place] and Dx mammogram. Further interventions pending results.   Raliegh Ip, DO [facility] [phone]

## 2023-06-18 ENCOUNTER — Other Ambulatory Visit (HOSPITAL_COMMUNITY): Payer: Self-pay | Admitting: Family Medicine

## 2023-06-18 DIAGNOSIS — N6310 Unspecified lump in the right breast, unspecified quadrant: Secondary | ICD-10-CM

## 2023-08-05 ENCOUNTER — Ambulatory Visit (HOSPITAL_COMMUNITY)
Admission: RE | Admit: 2023-08-05 | Discharge: 2023-08-05 | Disposition: A | Discharge status: Home / Self Care | Payer: Medicare Other | Source: Ambulatory Visit | Attending: Family Medicine | Admitting: Family Medicine

## 2023-08-05 ENCOUNTER — Ambulatory Visit (HOSPITAL_COMMUNITY)
Admission: RE | Admit: 2023-08-05 | Discharge: 2023-08-05 | Disposition: A | Discharge status: Home / Self Care | Payer: Medicare Other | Source: Ambulatory Visit | Attending: Family Medicine

## 2023-08-05 DIAGNOSIS — N63 Unspecified lump in unspecified breast: Secondary | ICD-10-CM

## 2023-08-05 DIAGNOSIS — R92313 Mammographic fatty tissue density, bilateral breasts: Secondary | ICD-10-CM | POA: Diagnosis not present

## 2023-08-05 DIAGNOSIS — Z1239 Encounter for other screening for malignant neoplasm of breast: Secondary | ICD-10-CM | POA: Insufficient documentation

## 2023-08-05 DIAGNOSIS — N6310 Unspecified lump in the right breast, unspecified quadrant: Secondary | ICD-10-CM

## 2023-08-12 ENCOUNTER — Ambulatory Visit: Payer: Medicare Other | Admitting: Family Medicine

## 2023-08-12 ENCOUNTER — Encounter: Payer: Self-pay | Admitting: Family Medicine

## 2023-08-12 VITALS — BP 140/83 | HR 60 | Temp 98.4°F | Ht 66.0 in | Wt 181.0 lb

## 2023-08-12 DIAGNOSIS — Z0001 Encounter for general adult medical examination with abnormal findings: Secondary | ICD-10-CM | POA: Diagnosis not present

## 2023-08-12 DIAGNOSIS — R3912 Poor urinary stream: Secondary | ICD-10-CM | POA: Diagnosis not present

## 2023-08-12 DIAGNOSIS — K219 Gastro-esophageal reflux disease without esophagitis: Secondary | ICD-10-CM | POA: Diagnosis not present

## 2023-08-12 DIAGNOSIS — E786 Lipoprotein deficiency: Secondary | ICD-10-CM

## 2023-08-12 DIAGNOSIS — N529 Male erectile dysfunction, unspecified: Secondary | ICD-10-CM

## 2023-08-12 DIAGNOSIS — R7309 Other abnormal glucose: Secondary | ICD-10-CM

## 2023-08-12 DIAGNOSIS — N401 Enlarged prostate with lower urinary tract symptoms: Secondary | ICD-10-CM | POA: Diagnosis not present

## 2023-08-12 DIAGNOSIS — G629 Polyneuropathy, unspecified: Secondary | ICD-10-CM

## 2023-08-12 DIAGNOSIS — Z0000 Encounter for general adult medical examination without abnormal findings: Secondary | ICD-10-CM

## 2023-08-12 LAB — BAYER DCA HB A1C WAIVED: HB A1C (BAYER DCA - WAIVED): 5.6 % (ref 4.8–5.6)

## 2023-08-12 LAB — LIPID PANEL

## 2023-08-12 MED ORDER — VARDENAFIL HCL 20 MG PO TABS: 20.0000 mg | ORAL_TABLET | Freq: Every day | ORAL | 99 refills | Status: DC | PRN

## 2023-08-12 NOTE — Progress Notes (Signed)
Geoffrey Bishop is a 68 y.o. male presents to office today for annual physical exam examination.    Concerns today include: 1.  None.  He is due well.  He had the evaluation of the lump in the right chest and this ended up being totally absent and he reports resolution.  He has no other concerns today.  His wife is doing well and he reports he is going to go workout today  Occupation: retired, Marital status: married, Substance use: none Health Maintenance Due  Topic Date Due   Pneumonia Vaccine 59+ Years old (1 of 1 - PCV) Never done   Refills needed today: levitra  Immunization History  Administered Date(s) Administered   [employer] Vaccination 09/22/2019, 10/20/2019, 05/10/2020   Td 04/26/2022   Tdap 07/15/1997, 08/31/2007   Tetanus Immune Globulin 07/15/1997   Zoster Recombinant(Shingrix) 05/16/2021, 08/08/2021   Past Medical History:  Diagnosis Date   BPH (benign prostatic hyperplasia)    Social History   Socioeconomic History   Marital status: Married    Spouse name: Meriam Sprague   Number of children: 2   Years of education: Not on file   Highest education level: Bachelor's degree (e.g., BA, AB, BS)  Occupational History   Occupation: retired     Comment: used to be a Production designer, theatre/television/film at [employer]  Tobacco Use   Smoking status: Never   Smokeless tobacco: Never  Vaping Use   Vaping status: Never Used  Substance and Sexual Activity   Alcohol use: Never   Drug use: Never   Sexual activity: Not on file  Other Topics Concern   Not on file  Social History Narrative   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  They have 2 children, a son and a daughter who reside in [place].   Patient enjoys being physically active.  He tries to maintain health through proper diet, exercise and herbal remedies.   Are you right handed or left handed? Right   Are you currently employed ?  retired   Caffeine 1 cup every other day   Do you live at home alone?with wife      What type of home do you live in: 1 story or 2 story? Two story        Social Drivers of Corporate investment banker Strain: Low Risk  (08/11/2023)   Overall Financial Resource Strain (CARDIA)    Difficulty of Paying Living Expenses: Not hard at all  Food Insecurity: No Food Insecurity (08/11/2023)   Hunger Vital Sign    Worried About Running Out of Food in the Last Year: Never true    Ran Out of Food in the Last Year: Never true  Transportation Needs: No Transportation Needs (08/11/2023)   PRAPARE - Administrator, Civil Service (Medical): No    Lack of Transportation (Non-Medical): No  Physical Activity: Insufficiently Active (08/11/2023)   Exercise Vital Sign    Days of Exercise per Week: 1 day    Minutes of Exercise per Session: 50 min  Stress: No Stress Concern Present (08/11/2023)   [employer] of Occupational Health - Occupational Stress Questionnaire    Feeling of Stress : Not at all  Social Connections: Moderately Integrated (02/27/2023)   Social Connection and Isolation Panel [NHANES]    Frequency of Communication with Friends and Family: More than three times a week    Frequency of Social  Gatherings with Friends and Family: More than three times a week    Attends Religious Services: More than 4 times per year    Active Member of [employer] or Organizations: No    Attends Banker Meetings: Never    Marital Status: Married  Catering manager Violence: Not At Risk (02/27/2023)   Humiliation, Afraid, Rape, and Kick questionnaire    Fear of Current or Ex-Partner: No    Emotionally Abused: No    Physically Abused: No    Sexually Abused: No   Past Surgical History:  Procedure Laterality Date   EYE SURGERY  2022   JOINT REPLACEMENT  2019   left knee   Family History  Problem Relation Age of Onset   Heart disease Mother    Lung cancer Brother     Current  Outpatient Medications:    cholecalciferol (VITAMIN D3) 25 MCG (1000 UNIT) tablet, Take 1,000 Units by mouth daily., Disp: , Rfl:    lansoprazole (PREVACID) 30 MG capsule, Take 1 capsule (30 mg total) by mouth daily as needed (GERD flareups (protonix not covered so this is a replacement))., Disp: 90 capsule, Rfl: 0   NON FORMULARY, 1 tablet daily, Disp: , Rfl:    vardenafil (LEVITRA) 20 MG tablet, Take 1 tablet (20 mg total) by mouth daily as needed for erectile dysfunction., Disp: 10 tablet, Rfl: 0   vitamin B-12 (CYANOCOBALAMIN) 100 MCG tablet, Take 100 mcg by mouth daily., Disp: , Rfl:   No Known Allergies   ROS: Review of Systems Pertinent items noted in HPI and remainder of comprehensive ROS otherwise negative.    Physical exam BP (!) 140/83   Pulse 60   Temp 98.4 F (36.9 C)   Ht 5\' 6"  (1.676 m)   Wt 181 lb (82.1 kg)   SpO2 96%   BMI 29.21 kg/m  General appearance: alert, cooperative, appears stated age, and no distress Head: Normocephalic, without obvious abnormality, atraumatic Eyes: negative findings: lids and lashes normal, conjunctivae and sclerae normal, corneas clear, and pupils equal, round, reactive to light and accomodation Ears: normal TM's and external ear canals both ears Nose: Nares normal. Septum midline. Mucosa normal. No drainage or sinus tenderness. Throat: lips, mucosa, and tongue normal; teeth and gums normal Neck: no adenopathy, supple, symmetrical, trachea midline, and thyroid not enlarged, symmetric, no tenderness/mass/nodules Back: symmetric, no curvature. ROM normal. No CVA tenderness. Lungs: clear to auscultation bilaterally Chest wall: no tenderness Heart: regular rate and rhythm, S1, S2 normal, no murmur, click, rub or gallop Abdomen: soft, non-tender; bowel sounds normal; no masses,  no organomegaly Extremities: extremities normal, atraumatic, no cyanosis or edema Pulses: 2+ and symmetric Skin: Skin color, texture, turgor normal. No rashes or  lesions Lymph nodes: Cervical, supraclavicular, and axillary nodes normal. Neurologic: Grossly normal      08/12/2023   10:20 AM 02/27/2023   10:35 AM 04/26/2022   10:23 AM  Depression screen PHQ 2/9  Decreased Interest 0 0 0  Down, Depressed, Hopeless 0 0 0  PHQ - 2 Score 0 0 0  Altered sleeping 0    Tired, decreased energy 0    Change in appetite 0    Feeling bad or failure about yourself  0    Trouble concentrating 0    Moving slowly or fidgety/restless 0    Suicidal thoughts 0    PHQ-9 Score 0    Difficult doing work/chores Not difficult at all        08/12/2023  10:20 AM 04/26/2022   10:23 AM 06/27/2021    4:24 PM 01/30/2021    3:17 PM  GAD 7 : Generalized Anxiety Score  Nervous, Anxious, on Edge 0 0 0 0  Control/stop worrying 0 0 0 0  Worry too much - different things 0 0 0 0  Trouble relaxing 0 0 0 0  Restless 0 0 0 0  Easily annoyed or irritable 0 0 0 0  Afraid - awful might happen 0 0 0 0  Total GAD 7 Score 0 0 0 0  Anxiety Difficulty Not difficult at all Not difficult at all Not difficult at all Not difficult at all     Assessment/ Plan: Allen Norris here for annual physical exam.   Annual physical exam  Benign prostatic hyperplasia with weak urinary stream - Plan: PSA  Gastroesophageal reflux disease without esophagitis - Plan: CMP14+EGFR, CBC  Peripheral polyneuropathy - Plan: CMP14+EGFR, CBC  Elevated hemoglobin A1c - Plan: CMP14+EGFR, Bayer DCA Hb A1c Waived  Low HDL (under 40) - Plan: Lipid Panel  Erectile dysfunction, unspecified erectile dysfunction type - Plan: vardenafil (LEVITRA) 20 MG tablet  Healthy male.  Asymptomatic from a prostate standpoint.  Check PSA.  Check CBC given history of GERD but not utilizing PPI regularly.  Check renal function  Fasting lipid collected.  A1c collected given history of elevation in hemoglobin A1c.  Neuropathy is chronic and stable and he is had nerve conduction studies which do noted previous injury  causing intermittent neuropathy associated with activity.  Levitra works well for ED.  This has been renewed for patient.  He may follow-up in 1 year  Counseled on healthy lifestyle choices, including diet (rich in fruits, vegetables and lean meats and low in salt and simple carbohydrates) and exercise (at least 30 minutes of moderate physical activity daily).  Patient to follow up 1 year for CPE  Macaela Presas M. Nadine Counts, DO

## 2023-08-13 ENCOUNTER — Encounter: Payer: Self-pay | Admitting: Family Medicine

## 2023-08-13 LAB — CBC
Hematocrit: 43.6 % (ref 37.5–51.0)
Hemoglobin: 14.8 g/dL (ref 13.0–17.7)
MCH: 30.1 pg (ref 26.6–33.0)
MCHC: 33.9 g/dL (ref 31.5–35.7)
MCV: 89 fL (ref 79–97)
Platelets: 202 10*3/uL (ref 150–450)
RBC: 4.91 x10E6/uL (ref 4.14–5.80)
RDW: 12.6 % (ref 11.6–15.4)
WBC: 5.1 10*3/uL (ref 3.4–10.8)

## 2023-08-13 LAB — CMP14+EGFR
ALT: 17 [IU]/L (ref 0–44)
AST: 21 [IU]/L (ref 0–40)
Albumin: 4.4 g/dL (ref 3.9–4.9)
Alkaline Phosphatase: 64 [IU]/L (ref 44–121)
BUN/Creatinine Ratio: 9 — ABNORMAL LOW (ref 10–24)
BUN: 9 mg/dL (ref 8–27)
Bilirubin Total: 0.6 mg/dL (ref 0.0–1.2)
CO2: 22 mmol/L (ref 20–29)
Calcium: 9.6 mg/dL (ref 8.6–10.2)
Chloride: 107 mmol/L — ABNORMAL HIGH (ref 96–106)
Creatinine, Ser: 0.96 mg/dL (ref 0.76–1.27)
Globulin, Total: 2.3 g/dL (ref 1.5–4.5)
Glucose: 105 mg/dL — ABNORMAL HIGH (ref 70–99)
Potassium: 4.6 mmol/L (ref 3.5–5.2)
Sodium: 142 mmol/L (ref 134–144)
Total Protein: 6.7 g/dL (ref 6.0–8.5)
eGFR: 87 mL/min/{1.73_m2} (ref >59)

## 2023-08-13 LAB — LIPID PANEL
Cholesterol, Total: 171 mg/dL (ref 100–199)
HDL: 38 mg/dL — ABNORMAL LOW (ref >39)
LDL CALC COMMENT:: 4.5 ratio (ref 0.0–5.0)
LDL Chol Calc (NIH): 110 mg/dL — ABNORMAL HIGH (ref 0–99)
Triglycerides: 128 mg/dL (ref 0–149)
VLDL Cholesterol Cal: 23 mg/dL (ref 5–40)

## 2023-08-13 LAB — PSA: Prostate Specific Ag, Serum: 7.5 ng/mL — ABNORMAL HIGH (ref 0.0–4.0)

## 2023-09-05 ENCOUNTER — Ambulatory Visit: Payer: Medicare Other | Admitting: Urology

## 2023-09-09 NOTE — Progress Notes (Unsigned)
 Name: Geoffrey Bishop DOB: 1955/08/08 MRN: [id number]  History of Present Illness: Geoffrey Bishop is a 68 y.o. male who presents today for follow up visit at [facility].  - GU history: 1. BPH with LUTS (weak stream). 2. Elevated PSA. - No family history of prostate cancer.  3. Kidney stones. Distant history >15 years ago. 4. Erectile dysfunction. - Takes Levitra PRN.   PSA values: - 12/22/2014: 2.87 - 09/23/2016: 3.95 - 10/13/2019: 4.7 - 04/26/2022: 6.6 - 08/12/2023: 7.5  At last visit with Dr. Pete Glatter on 05/08/2022: - Underwent office prostate ultrasound and biopsy.  - "Diagnosis of ASAP. Recommend follow-up in approximately 4 months with close monitoring of his PSA."  Today: He reports weak urinary stream but otherwise no bothersome urinary symptoms - denies urgency, frequency, nocturia, dysuria, gross hematuria, hesitancy, straining to void, or sensations of incomplete emptying.   Denies concern for stones at this time; no flank or abdominal pain.  States Levitra is working somewhat adequately for his ED but has caused headache so he's unsure if he will continue that.   Fall Screening: Do you usually have a device to assist in your mobility? No   Medications: Current Outpatient Medications  Medication Sig Dispense Refill   cholecalciferol (VITAMIN D3) 25 MCG (1000 UNIT) tablet Take 1,000 Units by mouth daily.     lansoprazole (PREVACID) 30 MG capsule Take 1 capsule (30 mg total) by mouth daily as needed (GERD flareups (protonix not covered so this is a replacement)). 90 capsule 0   NON FORMULARY 1 tablet daily     vardenafil (LEVITRA) 20 MG tablet Take 1 tablet (20 mg total) by mouth daily as needed for erectile dysfunction. 30 tablet PRN   vitamin B-12 (CYANOCOBALAMIN) 100 MCG tablet Take 100 mcg by mouth daily.     No current facility-administered medications for this visit.    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  They have 2 children, a son and a daughter who reside in [place].   Patient enjoys being physically active.  He tries to maintain health through proper diet, exercise and herbal remedies.   Are you right handed or left handed? Right   Are you currently employed ? retired   Caffeine 1 cup every other day   Do you live at home  alone?with wife      What type of home do you live in: 1 story or 2 story? Two story        Social Drivers of Corporate investment banker Strain: Low Risk  (08/11/2023)   Overall Financial Resource Strain (CARDIA)    Difficulty of Paying Living Expenses: Not hard at all  Food Insecurity: No Food Insecurity (08/11/2023)   Hunger Vital Sign    Worried About Running Out of Food in the Last Year: Never true    Ran Out of Food in the Last Year: Never true  Transportation Needs: No Transportation Needs (08/11/2023)   PRAPARE - Administrator, Civil Service (Medical): No    Lack of  Transportation (Non-Medical): No  Physical Activity: Insufficiently Active (08/11/2023)   Exercise Vital Sign    Days of Exercise per Week: 1 day    Minutes of Exercise per Session: 50 min  Stress: No Stress Concern Present (08/11/2023)   [employer] of Occupational Health - Occupational Stress Questionnaire    Feeling of Stress : Not at all  Social Connections: Moderately Integrated (02/27/2023)   Social Connection and Isolation Panel [NHANES]    Frequency of Communication with Friends and Family: More than three times a week    Frequency of Social Gatherings with Friends and Family: More than three times a week    Attends Religious Services: More than 4 times per year    Active Member of [employer] or Organizations: No    Attends Banker Meetings: Never    Marital Status: Married  Catering manager Violence: Not At Risk (02/27/2023)   Humiliation, Afraid, Rape, and Kick questionnaire    Fear of Current or Ex-Partner: No    Emotionally Abused: No    Physically Abused: No    Sexually Abused: No    Review of Systems Constitutional: Patient denies any unintentional weight loss or change in strength lntegumentary: Patient denies any rashes or pruritus Cardiovascular: Patient denies chest pain or syncope Respiratory: Patient denies shortness of breath Gastrointestinal: Patient denies nausea, vomiting, constipation, or diarrhea Musculoskeletal: Patient denies muscle cramps or weakness Neurologic: Patient denies convulsions or seizures Allergic/Immunologic: Patient denies recent allergic reaction(s) Hematologic/Lymphatic: Patient denies bleeding tendencies Endocrine: Patient denies heat/cold intolerance  GU: As per HPI.  OBJECTIVE Vitals:   09/10/23 0824  BP: (!) 151/81  Pulse: (!) 53  Temp: 98.7 F (37.1 C)   There is no height or weight on file to calculate BMI.  Physical Examination Constitutional: No obvious distress; patient is non-toxic appearing   Cardiovascular: No visible lower extremity edema.  Respiratory: The patient does not have audible wheezing/stridor; respirations do not appear labored  Gastrointestinal: Abdomen non-distended Musculoskeletal: Normal ROM of UEs  Skin: No obvious rashes/open sores  Neurologic: CN 2-12 grossly intact Psychiatric: Answered questions appropriately with normal affect  Hematologic/Lymphatic/Immunologic: No obvious bruises or sites of spontaneous bleeding  UA: negative  PVR: 7 ml  ASSESSMENT BPH without urinary obstruction - Plan: Urinalysis, Routine w reflex microscopic, BLADDER SCAN AMB NON-IMAGING, PR COMPLEX UROFLOMETRY, MR PROSTATE W WO CONTRAST  Elevated PSA - Plan: Urinalysis, Routine w reflex microscopic, BLADDER SCAN AMB NON-IMAGING, PR COMPLEX UROFLOMETRY, MR PROSTATE W WO CONTRAST  Erectile dysfunction, unspecified erectile dysfunction type  1. BPH with LUTS (weak stream).  We discussed medication options (alpha-1 blockers and/or 5a-reductase inhibitors) including mechanism of action and potential risks / benefits. He elected to proceed with expectant management as his symptoms are not significantly  bothersome and PVR is normal.  2. Elevated PSA. The patient was counseled in detail about his elevated serum PSA value, which is a protein produced in normal and neoplastic cells. Age norms and PSA velocity ("trend") were discussed. Findings and implications of elevated PSA discussed with patient. Advised MRI prostate for further evaluation at this point and close PSA surveillance. May need repeat prostate biopsy depending on MRI findings.  3. Kidney stones. Asymptomatic.  4. Erectile dysfunction. OK to continue Levitra PRN as prescribed by PCP.   We agreed to plan for follow up in 4-6 weeks with urology MD following MRI prostate. Patient verbalized understanding of and agreement with current plan. All questions were answered.  PLAN Advised the following: 1. MRI prostate. 2. Return  in about 4 weeks (around 10/08/2023) for 1st available cystoscopy with any urology MD (after MRI prostate).  Orders Placed This Encounter  Procedures   MR PROSTATE W WO CONTRAST    Standing Status:   Future    Expiration Date:   09/09/2024    If indicated for the ordered procedure, I authorize the administration of contrast media per Radiology protocol:   Yes    What is the patient's sedation requirement?:   No Sedation    Does the patient have a pacemaker or implanted devices?:   No    Preferred imaging location?:   [facility] (table limit - 550lbs)   Urinalysis, Routine w reflex microscopic   PR COMPLEX UROFLOMETRY   BLADDER SCAN AMB NON-IMAGING   Total time spent caring for the patient today was over 30 minutes. This includes time spent on the date of the visit reviewing the patient's chart before the visit, time spent during the visit, and time spent after the visit on documentation. Over 50% of that time was spent in face-to-face time with this patient for direct counseling. E&M based on time and complexity of medical decision making.  It has been explained that the patient is to follow regularly with their PCP in addition to all other providers involved in their care and to follow instructions provided by these respective offices. Patient advised to contact urology clinic if any urologic-pertaining questions, concerns, new symptoms or problems arise in the interim period.  There are no Patient Instructions on file for this visit.  Electronically signed by:  Donnita Falls, FNP   09/10/23    9:02 AM

## 2023-09-10 ENCOUNTER — Encounter: Payer: Self-pay | Admitting: Urology

## 2023-09-10 ENCOUNTER — Ambulatory Visit: Payer: Medicare Other | Admitting: Urology

## 2023-09-10 VITALS — BP 151/81 | HR 53 | Temp 98.7°F

## 2023-09-10 DIAGNOSIS — N529 Male erectile dysfunction, unspecified: Secondary | ICD-10-CM

## 2023-09-10 DIAGNOSIS — N401 Enlarged prostate with lower urinary tract symptoms: Secondary | ICD-10-CM

## 2023-09-10 DIAGNOSIS — R3912 Poor urinary stream: Secondary | ICD-10-CM

## 2023-09-10 DIAGNOSIS — R972 Elevated prostate specific antigen [PSA]: Secondary | ICD-10-CM | POA: Diagnosis not present

## 2023-09-10 DIAGNOSIS — N400 Enlarged prostate without lower urinary tract symptoms: Secondary | ICD-10-CM

## 2023-09-10 LAB — URINALYSIS, ROUTINE W REFLEX MICROSCOPIC
Bilirubin, UA: NEGATIVE
Glucose, UA: NEGATIVE
Ketones, UA: NEGATIVE
Leukocytes,UA: NEGATIVE
Nitrite, UA: NEGATIVE
Protein,UA: NEGATIVE
RBC, UA: NEGATIVE
Specific Gravity, UA: 1.025 (ref 1.005–1.030)
Urobilinogen, Ur: 1 mg/dL (ref 0.2–1.0)
pH, UA: 6 (ref 5.0–7.5)

## 2023-09-10 LAB — BLADDER SCAN AMB NON-IMAGING: Scan Result: 7

## 2023-09-10 NOTE — Progress Notes (Signed)
 Uroflowmetry order received. Patient voided own on with the following results:   Uroflow  Peak Flow: 58 ml Average Flow: 4 ml Voided Volume: 57 ml Voiding Time: 58 sec Flow Time: 12 sec Time to Peak Flow: 1 sec   Urine sent for Labs

## 2023-09-22 ENCOUNTER — Encounter: Payer: Self-pay | Admitting: Urology

## 2023-09-22 ENCOUNTER — Ambulatory Visit (HOSPITAL_COMMUNITY)
Admission: RE | Admit: 2023-09-22 | Discharge: 2023-09-22 | Disposition: A | Discharge status: Home / Self Care | Payer: Medicare Other | Source: Ambulatory Visit | Attending: Urology | Admitting: Urology

## 2023-09-22 DIAGNOSIS — N400 Enlarged prostate without lower urinary tract symptoms: Secondary | ICD-10-CM | POA: Insufficient documentation

## 2023-09-22 DIAGNOSIS — R972 Elevated prostate specific antigen [PSA]: Secondary | ICD-10-CM | POA: Insufficient documentation

## 2023-09-22 DIAGNOSIS — K5730 Diverticulosis of large intestine without perforation or abscess without bleeding: Secondary | ICD-10-CM | POA: Diagnosis not present

## 2023-09-22 MED ORDER — GADOBUTROL 1 MMOL/ML IV SOLN
7.0000 mL | Freq: Once | INTRAVENOUS | Status: AC | PRN
  Administered 2023-09-22: 7 mL via INTRAVENOUS

## 2023-10-08 DIAGNOSIS — H26491 Other secondary cataract, right eye: Secondary | ICD-10-CM | POA: Diagnosis not present

## 2023-10-08 DIAGNOSIS — Z961 Presence of intraocular lens: Secondary | ICD-10-CM | POA: Diagnosis not present

## 2023-10-08 DIAGNOSIS — H40023 Open angle with borderline findings, high risk, bilateral: Secondary | ICD-10-CM | POA: Diagnosis not present

## 2023-10-08 DIAGNOSIS — H25812 Combined forms of age-related cataract, left eye: Secondary | ICD-10-CM | POA: Diagnosis not present

## 2023-10-13 ENCOUNTER — Other Ambulatory Visit: Payer: Medicare Other | Admitting: Urology

## 2023-12-01 ENCOUNTER — Ambulatory Visit: Admitting: Urology

## 2023-12-01 VITALS — BP 142/74 | HR 65

## 2023-12-01 DIAGNOSIS — R972 Elevated prostate specific antigen [PSA]: Secondary | ICD-10-CM

## 2023-12-01 DIAGNOSIS — N400 Enlarged prostate without lower urinary tract symptoms: Secondary | ICD-10-CM | POA: Diagnosis not present

## 2023-12-01 LAB — URINALYSIS, ROUTINE W REFLEX MICROSCOPIC
Bilirubin, UA: NEGATIVE
Glucose, UA: NEGATIVE
Ketones, UA: NEGATIVE
Leukocytes,UA: NEGATIVE
Nitrite, UA: NEGATIVE
Protein,UA: NEGATIVE
RBC, UA: NEGATIVE
Specific Gravity, UA: 1.02 (ref 1.005–1.030)
Urobilinogen, Ur: 0.2 mg/dL (ref 0.2–1.0)
pH, UA: 6 (ref 5.0–7.5)

## 2023-12-01 MED ORDER — CIPROFLOXACIN HCL 500 MG PO TABS
500.0000 mg | ORAL_TABLET | Freq: Once | ORAL | Status: AC
  Administered 2023-12-01: 500 mg via ORAL

## 2023-12-01 NOTE — Progress Notes (Unsigned)
 12/01/2023 9:29 AM   Geoffrey Bishop 11/18/1955 [id number]  Referring provider: Eliodoro Guerin, DO [address] [place],  [place] [address]  No chief complaint on file.   HPI: New pt for me --   1) PSA elevation - PSA was 4.7 in 2021 and 6.6 in 2023. Oct 2023 prostate BX with atypia right mid. Prostate 40 g. PSA rose to 7.5 in Jan 2025. His Mar 2025 pMRI with 14 mm PIRADS 4 lesion right ANTERIOR TZ. Prostate 43 g, neg staging. PSAD = 0.19. No family history of prostate cancer.  2) BPH - prostate 40 g on imaging. On surveillance. No bothersome LUTS.   Today - seen for two issues 1) cystoscopy for LUTS, 2) management of PSA elevation.  IPSS 16 but not particularly bothered.  Also reviewed his PSA levels and MRI findings.  I drew him a picture of the anatomy on the board.  We discussed the difference in BPH versus prostate cancer treatment.    PMH: Past Medical History:  Diagnosis Date   BPH (benign prostatic hyperplasia)     Surgical History: Past Surgical History:  Procedure Laterality Date   EYE SURGERY  2022   JOINT REPLACEMENT  2019   left knee    Home Medications:  Allergies as of 12/01/2023   No Known Allergies      Medication List        Accurate as of Dec 01, 2023  9:29 AM. If you have any questions, ask your nurse or doctor.          cholecalciferol 25 MCG (1000 UNIT) tablet Commonly known as: VITAMIN D3 Take 1,000 Units by mouth daily.   lansoprazole 30 MG capsule Commonly known as: PREVACID Take 1 capsule (30 mg total) by mouth daily as needed (GERD flareups (protonix  not covered so this is a replacement)).   NON FORMULARY 1 tablet daily   vardenafil  20 MG tablet Commonly known as: LEVITRA  Take 1 tablet (20 mg total) by mouth daily as needed for erectile dysfunction.   vitamin B-12 100 MCG tablet Commonly known as: CYANOCOBALAMIN  Take 100 mcg by mouth daily.        Allergies: No Known Allergies  Family History: Family History   Problem Relation Age of Onset   Heart disease Mother    Lung cancer Brother     Social History:  reports that he has never smoked. He has never used smokeless tobacco. He reports that he does not drink alcohol and does not use drugs.   Physical Exam: BP (!) 142/74   Pulse 65   Constitutional:  Alert and oriented, No acute distress. HEENT: [place] AT, moist mucus membranes.  Trachea midline, no masses. Cardiovascular: No clubbing, cyanosis, or edema. Respiratory: Normal respiratory effort, no increased work of breathing. GI: Abdomen is soft, nontender, nondistended, no abdominal masses GU: No CVA tenderness Lymph: No cervical or inguinal lymphadenopathy. Skin: No rashes, bruises or suspicious lesions. Neurologic: Grossly intact, no focal deficits, moving all 4 extremities. Psychiatric: Normal mood and affect.   Cystoscopy Procedure Note  Patient identification was confirmed, informed consent was obtained, and patient was prepped using Betadine solution.  Lidocaine  jelly was administered per urethral meatus.     Pre-Procedure: - Inspection reveals a normal caliber ureteral meatus.  Procedure: The flexible cystoscope was introduced without difficulty - No urethral strictures/lesions are present. - Obstructing prostate with lateral lobe hypertrophy but minimal IPP - High bladder neck - Bilateral ureteral orifices identified - Bladder mucosa  reveals no ulcers, tumors, or lesions - No bladder stones - No trabeculation  Retroflexion shows normal anterior bladder    Post-Procedure: - Patient tolerated the procedure well    Laboratory Data: Lab Results  Component Value Date   WBC 5.1 08/12/2023   HGB 14.8 08/12/2023   HCT 43.6 08/12/2023   MCV 89 08/12/2023   PLT 202 08/12/2023    Lab Results  Component Value Date   CREATININE 0.96 08/12/2023    No results found for: "PSA"  No results found for: "TESTOSTERONE"  Lab Results  Component Value Date   HGBA1C 5.6  08/12/2023    Urinalysis    Component Value Date/Time   APPEARANCEUR Clear 09/10/2023 0827   GLUCOSEU Negative 09/10/2023 0827   BILIRUBINUR Negative 09/10/2023 0827   PROTEINUR Negative 09/10/2023 0827   NITRITE Negative 09/10/2023 0827   LEUKOCYTESUR Negative 09/10/2023 0827    Lab Results  Component Value Date   LABMICR Comment 09/10/2023    Pertinent Imaging: Mar 2025 mri images.    Assessment & Plan:    1. BPH without urinary obstruction (Primary) We discussed the cystoscopy results.  He is a good candidate for variety of interventions such as alpha blockers, [place] TT, ITID, LEP and possibly AQB. He will consider.  Not too bothered and doesn't want to start a medicine.  - Urinalysis, Routine w reflex microscopic - ciprofloxacin  (CIPRO ) tablet 500 mg - Cystoscopy  2. ELEVATED PSA -again discussed his higher PSA density, MRI findings and the nature risk benefits and alternatives to fusion prostate biopsy.  This information would help us  risk stratify him going forward and make a plan for his mild BPH as well as potential prostate cancer.  All questions answered he elects to proceed.  Referral to [facility] any available MD.  No follow-ups on file.  Christina Coyer, MD  [facility]  [address] [place], [place] [address] [phone]

## 2024-01-21 ENCOUNTER — Telehealth: Payer: Self-pay

## 2024-01-21 NOTE — Telephone Encounter (Signed)
-----   Message from Donnice Brooks sent at 01/20/2024  9:27 PM EDT ----- He has a fusion prostate bx at [facility] 7/9. He needs to see me back for biopsy results in 2-3 weeks. DO NOT overbook, bump someone if needed, thank you  -- Dr FORBES.

## 2024-01-21 NOTE — Telephone Encounter (Signed)
 Called pt to confirm scheduled appt per MD eskridge request pt confirmed stating he had to be in [place] at 5 pm but would try to make it work

## 2024-01-26 ENCOUNTER — Encounter: Payer: Self-pay | Admitting: Urology

## 2024-02-02 ENCOUNTER — Ambulatory Visit: Admitting: Urology

## 2024-02-02 VITALS — BP 124/67 | HR 69

## 2024-02-02 DIAGNOSIS — C61 Malignant neoplasm of prostate: Secondary | ICD-10-CM

## 2024-02-02 NOTE — Progress Notes (Addendum)
 02/02/2024 3:07 PM   Geoffrey Bishop 1955-11-15 [id number]  Referring provider: Jolinda Norene HERO, DO [address] [place],  [place] [address]  No chief complaint on file.   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 H/o PSA was 4.7 in 2021 and 6.6 in 2023. Oct 2023 prostate BX with atypia right mid. Prostate 40 g. PSA rose to 7.5 in Jan 2025. His Mar 2025 pMRI with 14 mm PIRADS 4 lesion right ANTERIOR TZ. Prostate 43 g, neg staging. PSAD = 0.19. No family history of prostate cancer.  Biopsy: Jul 2025 High risk PCa - Stage IIC PSA 7.5 T1c Prostate 42 g GG4 in one core left apex, 20% - reviewed slides and path with another pathologist who agrees.  Right ROI negative  Staging:  Mar 2025 pMRI with 14 mm PIRADS 4 lesion right ANTERIOR TZ. Prostate 43 g, neg staging.    2) BPH - prostate 40 g on imaging. On surveillance. No bothersome LUTS. May 2025 cysto benign - lateral lobes and high BN. IPSS was 16.    Today, seen for the above. He is well after biopsy. IPSS 7.   PMH: Past Medical History:  Diagnosis Date   BPH (benign prostatic hyperplasia)     Surgical History: Past Surgical History:  Procedure Laterality Date   EYE SURGERY  2022   JOINT REPLACEMENT  2019   left knee    Home Medications:  Allergies as of 02/02/2024   No Known Allergies      Medication List        Accurate as of February 02, 2024  3:07 PM. If you have any questions, ask your nurse or doctor.          cholecalciferol 25 MCG (1000 UNIT) tablet Commonly known as: VITAMIN D3 Take 1,000 Units by mouth daily.   lansoprazole 30 MG capsule Commonly known as: PREVACID Take 1 capsule (30 mg total) by mouth daily as needed (GERD flareups (protonix  not covered so this is a replacement)).   NON FORMULARY 1 tablet daily   vardenafil  20 MG tablet Commonly known as: LEVITRA  Take 1 tablet (20 mg total) by mouth daily as needed for erectile dysfunction.   vitamin  B-12 100 MCG tablet Commonly known as: CYANOCOBALAMIN  Take 100 mcg by mouth daily.        Allergies: No Known Allergies  Family History: Family History  Problem Relation Age of Onset   Heart disease Mother    Lung cancer Brother     Social History:  reports that he has never smoked. He has never used smokeless tobacco. He reports that he does not drink alcohol and does not use drugs.   Physical Exam: There were no vitals taken for this visit.  Constitutional:  Alert and oriented, No acute distress. HEENT: [place] AT, moist mucus membranes.  Trachea midline, no masses. Cardiovascular: No clubbing, cyanosis, or edema. Respiratory: Normal respiratory effort, no increased work of breathing. GI: Abdomen is soft, nontender, nondistended, no abdominal masses GU: No CVA tenderness Skin: No rashes, bruises or suspicious lesions. Neurologic: Grossly intact, no focal deficits, moving all 4 extremities. Psychiatric: Normal mood and affect.  Laboratory Data: Lab Results  Component Value Date   WBC 5.1 08/12/2023   HGB 14.8 08/12/2023   HCT 43.6 08/12/2023   MCV 89 08/12/2023   PLT 202 08/12/2023    Lab Results  Component Value Date   CREATININE 0.96 08/12/2023  No results found for: PSA  No results found for: TESTOSTERONE  Lab Results  Component Value Date   HGBA1C 5.6 08/12/2023    Urinalysis    Component Value Date/Time   APPEARANCEUR Clear 12/01/2023 0941   GLUCOSEU Negative 12/01/2023 0941   BILIRUBINUR Negative 12/01/2023 0941   PROTEINUR Negative 12/01/2023 0941   NITRITE Negative 12/01/2023 0941   LEUKOCYTESUR Negative 12/01/2023 0941    Lab Results  Component Value Date   LABMICR Comment 12/01/2023    Pertinent Imaging: Prostate MRI   Path report   Assessment & Plan:    1) PCa - I had a long discussion with the patient and his wife using the Prostate Cancer patient information and his path report. We went over his stage, grade and prognosis  and the relevant anatomy. We discussed the nature risks and benefits of active surveillance, radical prostatectomy, IMRT (+/- brachytherapy, +/- ADT x 1-3 years). We discussed specifically how each treatment might affect the bowel, bladder and sexual function. We discussed the role of other modalities in the treatment of prostate cancer including chemotherapy, HIFU and cryotherapy (possible focal at [facility]). We discussed how each treatment might effect salvage treatments and risk of recurrence. All questions answered. Will check PET scan and then discuss further. He will consider his options and pray about it in the meantime.    No follow-ups on file.  Donnice Brooks, MD  [facility]  [address] [place], [place] [address] [phone]  04/01/2024: ADD: Pt PET scan without evidence of metastatic disease. He has given it careful consideration. He would like a surgical consult with Dr. Renda. Referral made.

## 2024-02-19 ENCOUNTER — Ambulatory Visit (HOSPITAL_COMMUNITY)
Admission: RE | Admit: 2024-02-19 | Discharge: 2024-02-19 | Disposition: A | Discharge status: Home / Self Care | Source: Ambulatory Visit | Attending: Urology | Admitting: Urology

## 2024-02-19 DIAGNOSIS — C61 Malignant neoplasm of prostate: Secondary | ICD-10-CM | POA: Diagnosis present

## 2024-02-19 MED ORDER — FLOTUFOLASTAT F 18 GALLIUM 296-5846 MBQ/ML IV SOLN
8.4100 | Freq: Once | INTRAVENOUS | Status: AC
  Administered 2024-02-19: 8.41 via INTRAVENOUS
  Filled 2024-02-19: qty 9

## 2024-03-01 ENCOUNTER — Ambulatory Visit (INDEPENDENT_AMBULATORY_CARE_PROVIDER_SITE_OTHER): Payer: Medicare Other

## 2024-03-01 ENCOUNTER — Ambulatory Visit: Payer: Self-pay

## 2024-03-01 VITALS — BP 124/67 | HR 69 | Ht 66.0 in | Wt 181.0 lb

## 2024-03-01 DIAGNOSIS — Z0000 Encounter for general adult medical examination without abnormal findings: Secondary | ICD-10-CM

## 2024-03-01 NOTE — Progress Notes (Signed)
 Subjective:   Geoffrey Bishop is a 68 y.o. who presents for a Medicare Wellness preventive visit.  As a reminder, Annual Wellness Visits don't include a physical exam, and some assessments may be limited, especially if this visit is performed virtually. We may recommend an in-person follow-up visit with your provider if needed.  Visit Complete: Virtual I connected with  Geoffrey Bishop on 03/01/24 by a audio enabled telemedicine application and verified that I am speaking with the correct person using two identifiers.  Patient Location: Home  Provider Location: Home Office  I discussed the limitations of evaluation and management by telemedicine. The patient expressed understanding and agreed to proceed.  Vital Signs: Because this visit was a virtual/telehealth visit, some criteria may be missing or patient reported. Any vitals not documented were not able to be obtained and vitals that have been documented are patient reported.  VideoDeclined- This patient declined Librarian, academic. Therefore the visit was completed with audio only.  Persons Participating in Visit: Patient.  AWV Questionnaire: Yes: Patient Medicare AWV questionnaire was completed by the patient on 02/29/24; I have confirmed that all information answered by patient is correct and no changes since this date.        Objective:    Today's Vitals   03/01/24 0953  BP: 124/67  Pulse: 69  Weight: 181 lb (82.1 kg)  Height: 5' 6 (1.676 m)   Body mass index is 29.21 kg/m.     03/01/2024    9:55 AM 02/27/2023   10:36 AM 05/23/2022    1:33 PM  Advanced Directives  Does Patient Have a Medical Advance Directive? No;Yes Yes Yes  Type of Estate agent of [place];Living will Healthcare Power of [place];Living will Living will;Healthcare Power of Attorney  Copy of Healthcare Power of Attorney in Chart? No - copy requested No - copy requested     Current Medications  (verified) Outpatient Encounter Medications as of 03/01/2024  Medication Sig   cholecalciferol (VITAMIN D3) 25 MCG (1000 UNIT) tablet Take 1,000 Units by mouth daily.   lansoprazole (PREVACID) 30 MG capsule Take 1 capsule (30 mg total) by mouth daily as needed (GERD flareups (protonix  not covered so this is a replacement)).   NON FORMULARY 1 tablet daily   vardenafil  (LEVITRA ) 20 MG tablet Take 1 tablet (20 mg total) by mouth daily as needed for erectile dysfunction.   vitamin B-12 (CYANOCOBALAMIN ) 100 MCG tablet Take 100 mcg by mouth daily.   No facility-administered encounter medications on file as of 03/01/2024.    Allergies (verified) Patient has no known allergies.   History: Past Medical History:  Diagnosis Date   BPH (benign prostatic hyperplasia)    Past Surgical History:  Procedure Laterality Date   EYE SURGERY  2022   JOINT REPLACEMENT  2019   left knee   Family History  Problem Relation Age of Onset   Heart disease Mother    Alcohol abuse Mother    Alcohol abuse Father    Lung cancer Brother    Social History   Socioeconomic History   Marital status: Married    Spouse name: Rojelio   Number of children: 2   Years of education: Not on file   Highest education level: Bachelor's degree (e.g., BA, AB, BS)  Occupational History   Occupation: retired     Comment: used to be a Production designer, theatre/television/film at [employer]  Tobacco Use   Smoking status: Never   Smokeless tobacco: Never  Advertising account planner  Vaping status: Never Used  Substance and Sexual Activity   Alcohol use: Never   Drug use: Never   Sexual activity: Yes    Birth control/protection: Condom, None  Other Topics Concern   Not on file  Social History Narrative   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  They have 2 children, a son and a daughter who reside in [place].   Patient enjoys being physically active.  He tries to maintain  health through proper diet, exercise and herbal remedies.   Are you right handed or left handed? Right   Are you currently employed ? retired   Caffeine 1 cup every other day   Do you live at home alone?with wife      What type of home do you live in: 1 story or 2 story? Two story        Social Drivers of Corporate investment banker Strain: Low Risk  (08/11/2023)   Overall Financial Resource Strain (CARDIA)    Difficulty of Paying Living Expenses: Not hard at all  Food Insecurity: No Food Insecurity (08/11/2023)   Hunger Vital Sign    Worried About Running Out of Food in the Last Year: Never true    Ran Out of Food in the Last Year: Never true  Transportation Needs: No Transportation Needs (08/11/2023)   PRAPARE - Administrator, Civil Service (Medical): No    Lack of Transportation (Non-Medical): No  Physical Activity: Insufficiently Active (08/11/2023)   Exercise Vital Sign    Days of Exercise per Week: 1 day    Minutes of Exercise per Session: 50 min  Stress: No Stress Concern Present (08/11/2023)   [employer] of Occupational Health - Occupational Stress Questionnaire    Feeling of Stress : Not at all  Social Connections: Moderately Integrated (02/27/2023)   Social Connection and Isolation Panel    Frequency of Communication with Friends and Family: More than three times a week    Frequency of Social Gatherings with Friends and Family: More than three times a week    Attends Religious Services: More than 4 times per year    Active Member of [employer] or Organizations: No    Attends Engineer, structural: Never    Marital Status: Married    Tobacco Counseling Counseling given: Yes    Clinical Intake:  Pre-visit preparation completed: Yes  Pain : No/denies pain     BMI - recorded: 29.21 Nutritional Status: BMI 25 -29 Overweight Nutritional Risks: None Diabetes: No  Lab Results  Component Value Date   HGBA1C 5.6 08/12/2023   HGBA1C 5.6  04/30/2022   HGBA1C 5.8 (H) 04/26/2022     How often do you need to have someone help you when you read instructions, pamphlets, or other written materials from your doctor or pharmacy?: 1 - Never  Interpreter Needed?: No  Information entered by :: alia t/cma   Activities of Daily Living     02/29/2024    6:59 PM  In your present state of health, do you have any difficulty performing the following activities:  Hearing? 0  Vision? 0  Difficulty concentrating or making decisions? 0  Walking or climbing stairs? 0  Dressing or bathing? 0  Doing errands, shopping? 0  Preparing Food and eating ? N  Using the Toilet? N  In the past six months, have you accidently leaked urine? N  Do you have problems with loss of bowel control? N  Managing your Medications? N  Managing your Finances? N  Housekeeping or managing your Housekeeping? N    Patient Care Team: Jolinda Norene HERO, DO as PCP - General (Family Medicine)  I have updated your Care Teams any recent Medical Services you may have received from other providers in the past year.     Assessment:   This is a routine wellness examination for Geoffrey Bishop.  Hearing/Vision screen Hearing Screening - Comments:: Pt denies hearing dif Vision Screening - Comments:: Pt denies vision dif/pt [facility] in [place], last ov over a yr   Goals Addressed             This Visit's Progress    Patient Stated   On track    Maintain current health        Depression Screen     03/01/2024    9:56 AM 08/12/2023   10:20 AM 02/27/2023   10:35 AM 04/26/2022   10:23 AM 06/27/2021    4:24 PM 01/30/2021    3:17 PM 10/06/2019    2:01 PM  PHQ 2/9 Scores  PHQ - 2 Score 0 0 0 0 0 0 0  PHQ- 9 Score  0     0    Fall Risk     02/29/2024    6:59 PM 08/12/2023   10:20 AM 06/17/2023    9:59 AM 02/27/2023   10:33 AM 05/23/2022    1:33 PM  Fall Risk   Falls in the past year? 0 0 0 0 0  Number falls in past yr: 0 0 0 0 0  Injury with Fall? 0  0 0 0 0  Risk for fall due to :  No Fall Risks No Fall Risks No Fall Risks   Follow up  Falls evaluation completed Education provided Falls prevention discussed Falls evaluation completed      Data saved with a previous flowsheet row definition    MEDICARE RISK AT HOME:  Medicare Risk at Home Any stairs in or around the home?: (Patient-Rptd) Yes If so, are there any without handrails?: (Patient-Rptd) No Home free of loose throw rugs in walkways, pet beds, electrical cords, etc?: (Patient-Rptd) Yes Adequate lighting in your home to reduce risk of falls?: (Patient-Rptd) Yes Life alert?: (Patient-Rptd) No Use of a cane, walker or w/c?: (Patient-Rptd) No Grab bars in the bathroom?: (Patient-Rptd) No Shower chair or bench in shower?: (Patient-Rptd) No Elevated toilet seat or a handicapped toilet?: (Patient-Rptd) No  TIMED UP AND GO:  Was the test performed?  no  Cognitive Function: 6CIT completed        03/01/2024    9:57 AM 02/27/2023   10:37 AM  6CIT Screen  What Year? 0 points 0 points  What month? 0 points 0 points  What time? 0 points 0 points  Count back from 20 0 points 0 points  Months in reverse 0 points 0 points  Repeat phrase 0 points 0 points  Total Score 0 points 0 points    Immunizations Immunization History  Administered Date(s) Administered   Moderna Sars-Covid-2 Vaccination 09/22/2019, 10/20/2019, 05/10/2020   Td 04/26/2022   Tdap 07/15/1997, 08/31/2007   Tetanus Immune Globulin 07/15/1997   Zoster Recombinant(Shingrix) 05/16/2021, 08/08/2021    Screening Tests Health Maintenance  Topic Date Due   COVID-19 Vaccine (4 - 2024-25 season) 03/16/2023   INFLUENZA VACCINE  02/13/2024   Pneumococcal Vaccine: 50+ Years (1 of 1 - PCV)  08/11/2024 (Originally 03/08/2007)   Colonoscopy  01/24/2025   Medicare Annual Wellness (AWV)  03/01/2025   DTaP/Tdap/Td (4 - Td or Tdap) 04/26/2032   Hepatitis C Screening  Completed   Zoster Vaccines- Shingrix  Completed    HPV VACCINES  Aged Out   Meningococcal B Vaccine  Aged Out    Health Maintenance  Health Maintenance Due  Topic Date Due   COVID-19 Vaccine (4 - 2024-25 season) 03/16/2023   INFLUENZA VACCINE  02/13/2024   Health Maintenance Items Addressed: See Nurse Notes at the end of this note  Additional Screening:  Vision Screening: Recommended annual ophthalmology exams for early detection of glaucoma and other disorders of the eye. Would you like a referral to an eye doctor? No    Dental Screening: Recommended annual dental exams for proper oral hygiene  Community Resource Referral / Chronic Care Management: CRR required this visit?  No   CCM required this visit?  No   Plan:    I have personally reviewed and noted the following in the patient's chart:   Medical and social history Use of alcohol, tobacco or illicit drugs  Current medications and supplements including opioid prescriptions. Patient is not currently taking opioid prescriptions. Functional ability and status Nutritional status Physical activity Advanced directives List of other physicians Hospitalizations, surgeries, and ER visits in previous 12 months Vitals Screenings to include cognitive, depression, and falls Referrals and appointments  In addition, I have reviewed and discussed with patient certain preventive protocols, quality metrics, and best practice recommendations. A written personalized care plan for preventive services as well as general preventive health recommendations were provided to patient.   Ozie Ned, CMA   03/01/2024   After Visit Summary: (MyChart) Due to this being a telephonic visit, the after visit summary with patients personalized plan was offered to patient via MyChart   Notes: Nothing significant to report at this time.

## 2024-03-01 NOTE — Telephone Encounter (Signed)
 I called and discussed PET scan results with Geoffrey Bishop.  We discussed again the nature risks benefits and alternatives to radical prostatectomy versus radiation with ADT.  We discussed how each treatment would affect urinary and sexual function as well as potential salvage treatments.  His daughter knows someone around his age that had surgery, so would like to talk to him and make a decision.  I will have him follow-up in a few weeks to discuss further.

## 2024-03-01 NOTE — Patient Instructions (Signed)
 Mr. Stroebel , Thank you for taking time out of your busy schedule to complete your Annual Wellness Visit with me. I enjoyed our conversation and look forward to speaking with you again next year. I, as well as your care team,  appreciate your ongoing commitment to your health goals. Please review the following plan we discussed and let me know if I can assist you in the future. Your Game plan/ To Do List    Referrals: If you haven't heard from the office you've been referred to, please reach out to them at the phone provided.   Follow up Visits: We will see or speak with you next year for your Next Medicare AWV with our clinical staff on 03/02/25 at 10:00a.m. Have you seen your provider in the last 6 months (3 months if uncontrolled diabetes)? Yes  Clinician Recommendations:  Aim for 30 minutes of exercise or brisk walking, 6-8 glasses of water, and 5 servings of fruits and vegetables each day.       This is a list of the screenings recommended for you:  Health Maintenance  Topic Date Due   COVID-19 Vaccine (4 - 2024-25 season) 03/16/2023   Medicare Annual Wellness Visit  02/27/2024   Flu Shot  02/13/2024   Pneumococcal Vaccine for age over 72 (1 of 1 - PCV) 08/11/2024*   Colon Cancer Screening  01/24/2025   DTaP/Tdap/Td vaccine (4 - Td or Tdap) 04/26/2032   Hepatitis C Screening  Completed   Zoster (Shingles) Vaccine  Completed   HPV Vaccine  Aged Out   Meningitis B Vaccine  Aged Out  *Topic was postponed. The date shown is not the original due date.    Advanced directives: (Copy Requested) Please bring a copy of your health care power of attorney and living will to the office to be added to your chart at your convenience. You can mail to [facility] [address] [place], [place] [address] or email to [email or web address] Advance Care Planning is important because it:  [x]  Makes sure you receive the medical care that is consistent with your values, goals,  and preferences  [x]  It provides guidance to your family and loved ones and reduces their decisional burden about whether or not they are making the right decisions based on your wishes.  Follow the link provided in your after visit summary or read over the paperwork we have mailed to you to help you started getting your Advance Directives in place. If you need assistance in completing these, please reach out to us  so that we can help you!  See attachments for Preventive Care and Fall Prevention Tips.

## 2024-03-03 NOTE — Telephone Encounter (Signed)
-----   Message from Donnice Brooks sent at 03/02/2024  8:18 PM EDT ----- No -- I need to see him 2-4 weeks - OK to bump a 6 mo or yearly patient or a new patient. Thanks.  ----- Message ----- From: Sammie Exie HERO, CMA Sent: 03/02/2024   3:50 PM EDT To: Donnice Brooks, MD  Next open available is 01/05 unless you would like to use a cysto slot next available for that is 10/20 ----- Message ----- From: Brooks Donnice, MD Sent: 03/01/2024   3:39 PM EDT To: Exie HERO Sammie, CMA  I called him and he is sill deciding on what to do, so I need to see him back in 3-4 weeks to discuss - thanks!  ----- Message ----- From: Sammie Exie HERO, CMA Sent: 03/01/2024   8:26 AM EDT To: Donnice Brooks, MD  Please review. No f/u on file  ----- Message ----- From: Interface, Rad Results In Sent: 02/29/2024   7:13 AM EDT To: [facility]

## 2024-03-03 NOTE — Telephone Encounter (Signed)
 Called pt and left vm that we have made an appointment for his PET scan f/u and that a appointment reminder will be mailed to his home address

## 2024-04-01 ENCOUNTER — Telehealth: Payer: Self-pay | Admitting: Urology

## 2024-04-01 NOTE — Telephone Encounter (Signed)
 Has an appt and cannot make that time. He would like to go ahead with surgery with Dr. Renda. He wants Dr Nieves to get that started he does not want to prolong the process.

## 2024-04-01 NOTE — Addendum Note (Signed)
 Addended by: NIEVES COUGH R on: 04/01/2024 05:25 PM   Modules accepted: Orders

## 2024-04-01 NOTE — Telephone Encounter (Signed)
 FYI and advise please

## 2024-04-02 NOTE — Telephone Encounter (Signed)
 Called [facility] per MD recommendation to let referral coordinator aware he has put in a referral for pt Geoffrey Bishop took a message that I was advised would be sent to [facility] pt was made aware of referral pt notified that was referral was in and call [facility] if he hasn't heard anything by next Tuesday pt voiced his understanding

## 2024-04-12 ENCOUNTER — Ambulatory Visit: Admitting: Urology

## 2024-04-19 ENCOUNTER — Other Ambulatory Visit: Payer: Self-pay | Admitting: Urology

## 2024-05-06 DIAGNOSIS — M6281 Muscle weakness (generalized): Secondary | ICD-10-CM | POA: Diagnosis not present

## 2024-05-06 DIAGNOSIS — M6249 Contracture of muscle, multiple sites: Secondary | ICD-10-CM | POA: Diagnosis not present

## 2024-05-10 DIAGNOSIS — H25812 Combined forms of age-related cataract, left eye: Secondary | ICD-10-CM | POA: Diagnosis not present

## 2024-05-10 DIAGNOSIS — H40023 Open angle with borderline findings, high risk, bilateral: Secondary | ICD-10-CM | POA: Diagnosis not present

## 2024-05-10 DIAGNOSIS — Z961 Presence of intraocular lens: Secondary | ICD-10-CM | POA: Diagnosis not present

## 2024-05-12 NOTE — Patient Instructions (Signed)
 SURGICAL WAITING ROOM VISITATION  Patients having surgery or a procedure may have no more than 2 support people in the waiting area - these visitors may rotate.    Children under the age of 25 must have an adult with them who is not the patient.  Visitors with respiratory illnesses are discouraged from visiting and should remain at home.  If the patient needs to stay at the hospital during part of their recovery, the visitor guidelines for inpatient rooms apply. Pre-op nurse will coordinate an appropriate time for 1 support person to accompany patient in pre-op.  This support person may not rotate.    Please refer to the [facility] website for the visitor guidelines for Inpatients (after your surgery is over and you are in a regular room).    Your procedure is scheduled on: 05/24/24   Report to [facility] Main Entrance    Report to admitting at AM   Call this number if you have problems the morning of surgery [phone]   Follow a clear liquid diet the day before surgery.  Water Non-Citrus Juices (without pulp, NO RED-Apple, White grape, White cranberry) Black Coffee (NO MILK/CREAM OR CREAMERS, sugar ok)  Clear Tea (NO MILK/CREAM OR CREAMERS, sugar ok) regular and decaf                             Plain Jell-O (NO RED)                                           Fruit ices (not with fruit pulp, NO RED)                                     Popsicles (NO RED)                                                               Sports drinks like Gatorade (NO RED)              Nothing to drink after midnight.           If you have questions, please contact your surgeon's office.   FOLLOW BOWEL PREP AND ANY ADDITIONAL PRE OP INSTRUCTIONS YOU RECEIVED FROM YOUR SURGEON'S OFFICE!!! Magnesium citrate and fleet enema     Oral Hygiene is also important to reduce your risk of infection.                                    Remember - BRUSH YOUR TEETH THE MORNING OF SURGERY WITH YOUR  REGULAR TOOTHPASTE  DENTURES WILL BE REMOVED PRIOR TO SURGERY PLEASE DO NOT APPLY Poly grip OR ADHESIVES!!!   Do NOT smoke after Midnight   Stop all vitamins and herbal supplements 7 days before surgery.   Take these medicines the morning of surgery with A SIP OF WATER: None   DO NOT TAKE ANY ORAL DIABETIC MEDICATIONS DAY OF YOUR SURGERY  Bring CPAP mask and tubing day of  surgery.                              You may not have any metal on your body including jewelry, and body piercing             Do not wear lotions, powders, cologne, or deodorant              Men may shave face and neck.   Do not bring valuables to the hospital. [facility] IS NOT             RESPONSIBLE   FOR VALUABLES.   Contacts, glasses, dentures or bridgework may not be worn into surgery.   Bring small overnight bag day of surgery.   DO NOT BRING YOUR HOME MEDICATIONS TO THE HOSPITAL. PHARMACY WILL DISPENSE MEDICATIONS LISTED ON YOUR MEDICATION LIST TO YOU DURING YOUR ADMISSION IN THE HOSPITAL!   Special Instructions: Bring a copy of your healthcare power of attorney and living will documents the day of surgery if you haven't scanned them before.              Please read over the following fact sheets you were given: IF YOU HAVE QUESTIONS ABOUT YOUR PRE-OP INSTRUCTIONS PLEASE CALL [phone].   If you received a COVID test during your pre-op visit  it is requested that you wear a mask when out in public, stay away from anyone that may not be feeling well and notify your surgeon if you develop symptoms. If you test positive for Covid or have been in contact with anyone that has tested positive in the last 10 days please notify you surgeon.    [facility] - Preparing for Surgery Before surgery, you can play an important role.  Because skin is not sterile, your skin needs to be as free of germs as possible.  You can reduce the number of germs on your skin by washing with CHG (chlorahexidine  gluconate) soap before surgery.  CHG is an antiseptic cleaner which kills germs and bonds with the skin to continue killing germs even after washing. Please DO NOT use if you have an allergy to CHG or antibacterial soaps.  If your skin becomes reddened/irritated stop using the CHG and inform your nurse when you arrive at Short Stay. Do not shave (including legs and underarms) for at least 48 hours prior to the first CHG shower.  You may shave your face/neck.  Please follow these instructions carefully:  1.  Shower with CHG Soap the night before surgery ONLY (DO NOT USE THE SOAP THE MORNING OF SURGERY).  2.  If you choose to wash your hair, wash your hair first as usual with your normal  shampoo.  3.  After you shampoo, rinse your hair and body thoroughly to remove the shampoo.                             4.  Use CHG as you would any other liquid soap.  You can apply chg directly to the skin and wash.  Gently with a scrungie or clean washcloth.  5.  Apply the CHG Soap to your body ONLY FROM THE NECK DOWN.   Do   not use on face/ open  Wound or open sores. Avoid contact with eyes, ears mouth and   genitals (private parts).                       Wash face,  Genitals (private parts) with your normal soap.             6.  Wash thoroughly, paying special attention to the area where your    surgery  will be performed.  7.  Thoroughly rinse your body with warm water from the neck down.  8.  DO NOT shower/wash with your normal soap after using and rinsing off the CHG Soap.                9.  Pat yourself dry with a clean towel.            10.  Wear clean pajamas.            11.  Place clean sheets on your bed the night of your first shower and do not  sleep with pets. Day of Surgery : Do not apply any CHG, lotions/deodorants the morning of surgery.  Please wear clean clothes to the hospital/surgery center.  FAILURE TO FOLLOW THESE INSTRUCTIONS MAY RESULT IN THE CANCELLATION OF  YOUR SURGERY  PATIENT SIGNATURE_________________________________  NURSE SIGNATURE__________________________________  ________________________________________________________________________ WHAT IS A BLOOD TRANSFUSION? Blood Transfusion Information  A transfusion is the replacement of blood or some of its parts. Blood is made up of multiple cells which provide different functions. Red blood cells carry oxygen and are used for blood loss replacement. White blood cells fight against infection. Platelets control bleeding. Plasma helps clot blood. Other blood products are available for specialized needs, such as hemophilia or other clotting disorders. BEFORE THE TRANSFUSION  Who gives blood for transfusions?  Healthy volunteers who are fully evaluated to make sure their blood is safe. This is blood bank blood. Transfusion therapy is the safest it has ever been in the practice of medicine. Before blood is taken from a donor, a complete history is taken to make sure that person has no history of diseases nor engages in risky social behavior (examples are intravenous drug use or sexual activity with multiple partners). The donor's travel history is screened to minimize risk of transmitting infections, such as malaria. The donated blood is tested for signs of infectious diseases, such as HIV and hepatitis. The blood is then tested to be sure it is compatible with you in order to minimize the chance of a transfusion reaction. If you or a relative donates blood, this is often done in anticipation of surgery and is not appropriate for emergency situations. It takes many days to process the donated blood. RISKS AND COMPLICATIONS Although transfusion therapy is very safe and saves many lives, the main dangers of transfusion include:  Getting an infectious disease. Developing a transfusion reaction. This is an allergic reaction to something in the blood you were given. Every precaution is taken to prevent  this. The decision to have a blood transfusion has been considered carefully by your caregiver before blood is given. Blood is not given unless the benefits outweigh the risks. AFTER THE TRANSFUSION Right after receiving a blood transfusion, you will usually feel much better and more energetic. This is especially true if your red blood cells have gotten low (anemic). The transfusion raises the level of the red blood cells which carry oxygen, and this usually causes an energy increase. The nurse administering the transfusion  will monitor you carefully for complications. HOME CARE INSTRUCTIONS  No special instructions are needed after a transfusion. You may find your energy is better. Speak with your caregiver about any limitations on activity for underlying diseases you may have. SEEK MEDICAL CARE IF:  Your condition is not improving after your transfusion. You develop redness or irritation at the intravenous (IV) site. SEEK IMMEDIATE MEDICAL CARE IF:  Any of the following symptoms occur over the next 12 hours: Shaking chills. You have a temperature by mouth above 102 F (38.9 C), not controlled by medicine. Chest, back, or muscle pain. People around you feel you are not acting correctly or are confused. Shortness of breath or difficulty breathing. Dizziness and fainting. You get a rash or develop hives. You have a decrease in urine output. Your urine turns a dark color or changes to pink, red, or brown. Any of the following symptoms occur over the next 10 days: You have a temperature by mouth above 102 F (38.9 C), not controlled by medicine. Shortness of breath. Weakness after normal activity. The white part of the eye turns yellow (jaundice). You have a decrease in the amount of urine or are urinating less often. Your urine turns a dark color or changes to pink, red, or brown. Document Released: 06/28/2000 Document Revised: 09/23/2011 Document Reviewed: 02/15/2008 [facility] Patient  Information 2014 [place], [place].  _______________________________________________________________________

## 2024-05-12 NOTE — Progress Notes (Addendum)
 COVID Vaccine Completed: yes  Date of COVID positive in last 90 days:  PCP - Norene Fielding, DO Cardiologist - n/a  Chest x-ray - N/A EKG - N/A Stress Test - many years ago per pt- for bradycardia- no isses ECHO - N/A Cardiac Cath - n/a Pacemaker/ICD device last checked:N/A Spinal Cord Stimulator:N/A  Bowel Prep - clears day before, fleet enema, magnesium citrate   Sleep Study - N/A CPAP -   Fasting Blood Sugar - N/A Checks Blood Sugar _____ times a day  Last dose of GLP1 agonist-  N/A GLP1 instructions:  Do not take after     Last dose of SGLT-2 inhibitors-  N/A SGLT-2 instructions:  Do not take after     Blood Thinner Instructions: N/A Last dose:   Time: Aspirin Instructions:N/A Last Dose:  Activity level: Can go up a flight of stairs and perform activities of daily living without stopping and without symptoms of chest pain or shortness of breath.  Anesthesia review: N/A  Patient denies shortness of breath, fever, cough and chest pain at PAT appointment  Patient verbalized understanding of instructions that were given to them at the PAT appointment. Patient was also instructed that they will need to review over the PAT instructions again at home before surgery.

## 2024-05-13 ENCOUNTER — Encounter (HOSPITAL_COMMUNITY)
Admission: RE | Admit: 2024-05-13 | Discharge: 2024-05-13 | Disposition: A | Discharge status: Home / Self Care | Source: Ambulatory Visit | Attending: Urology | Admitting: Urology

## 2024-05-13 ENCOUNTER — Encounter (HOSPITAL_COMMUNITY): Payer: Self-pay

## 2024-05-13 ENCOUNTER — Other Ambulatory Visit: Payer: Self-pay

## 2024-05-13 DIAGNOSIS — Z01812 Encounter for preprocedural laboratory examination: Secondary | ICD-10-CM | POA: Insufficient documentation

## 2024-05-13 DIAGNOSIS — Z01818 Encounter for other preprocedural examination: Secondary | ICD-10-CM | POA: Diagnosis present

## 2024-05-13 HISTORY — DX: Polyneuropathy, unspecified: G62.9

## 2024-05-13 HISTORY — DX: Personal history of urinary calculi: Z87.442

## 2024-05-13 LAB — CBC
HCT: 47.8 % (ref 39.0–52.0)
Hemoglobin: 15.3 g/dL (ref 13.0–17.0)
MCH: 28.7 pg (ref 26.0–34.0)
MCHC: 32 g/dL (ref 30.0–36.0)
MCV: 89.5 fL (ref 80.0–100.0)
Platelets: 216 K/uL (ref 150–400)
RBC: 5.34 MIL/uL (ref 4.22–5.81)
RDW: 13 % (ref 11.5–15.5)
WBC: 6.2 K/uL (ref 4.0–10.5)
nRBC: 0 % (ref 0.0–0.2)

## 2024-05-13 LAB — TYPE AND SCREEN
ABO/RH(D): A POS
Antibody Screen: NEGATIVE

## 2024-05-13 LAB — BASIC METABOLIC PANEL WITH GFR
Anion gap: 8 (ref 5–15)
BUN: 11 mg/dL (ref 8–23)
CO2: 28 mmol/L (ref 22–32)
Calcium: 10 mg/dL (ref 8.9–10.3)
Chloride: 107 mmol/L (ref 98–111)
Creatinine, Ser: 1.04 mg/dL (ref 0.61–1.24)
GFR, Estimated: >60 mL/min (ref >60)
Glucose, Bld: 102 mg/dL — ABNORMAL HIGH (ref 70–99)
Potassium: 4.9 mmol/L (ref 3.5–5.1)
Sodium: 143 mmol/L (ref 135–145)

## 2024-05-24 ENCOUNTER — Ambulatory Visit (HOSPITAL_COMMUNITY): Payer: Self-pay

## 2024-05-24 ENCOUNTER — Other Ambulatory Visit: Payer: Self-pay

## 2024-05-24 ENCOUNTER — Observation Stay (HOSPITAL_COMMUNITY)
Admission: RE | Admit: 2024-05-24 | Discharge: 2024-05-25 | Disposition: A | Discharge status: Home / Self Care | Source: Ambulatory Visit | Attending: Urology | Admitting: Urology

## 2024-05-24 ENCOUNTER — Encounter (HOSPITAL_COMMUNITY): Admission: RE | Disposition: A | Payer: Self-pay | Source: Ambulatory Visit | Attending: Urology

## 2024-05-24 ENCOUNTER — Encounter (HOSPITAL_COMMUNITY): Payer: Self-pay | Admitting: Urology

## 2024-05-24 DIAGNOSIS — C61 Malignant neoplasm of prostate: Secondary | ICD-10-CM

## 2024-05-24 LAB — HEMOGLOBIN AND HEMATOCRIT, BLOOD
HCT: 44 % (ref 39.0–52.0)
Hemoglobin: 14 g/dL (ref 13.0–17.0)

## 2024-05-24 LAB — ABO/RH: ABO/RH(D): A POS

## 2024-05-24 SURGERY — PROSTATECTOMY, RADICAL, ROBOT-ASSISTED, LAPAROSCOPIC
Anesthesia: General | Site: Abdomen

## 2024-05-24 MED ORDER — ACETAMINOPHEN 500 MG PO TABS
1000.0000 mg | ORAL_TABLET | Freq: Once | ORAL | Status: AC
  Administered 2024-05-24: 1000 mg via ORAL
  Filled 2024-05-24: qty 2

## 2024-05-24 MED ORDER — SUGAMMADEX SODIUM 200 MG/2ML IV SOLN
INTRAVENOUS | Status: AC
  Filled 2024-05-24: qty 2

## 2024-05-24 MED ORDER — HYOSCYAMINE SULFATE 0.125 MG SL SUBL: 0.1250 mg | SUBLINGUAL_TABLET | Freq: Four times a day (QID) | SUBLINGUAL | Status: DC | PRN

## 2024-05-24 MED ORDER — ACETAMINOPHEN 325 MG PO TABS: 650.0000 mg | ORAL_TABLET | ORAL | Status: DC | PRN

## 2024-05-24 MED ORDER — SODIUM CHLORIDE 0.9 % IV BOLUS
1000.0000 mL | Freq: Once | INTRAVENOUS | Status: AC
  Administered 2024-05-24: 1000 mL via INTRAVENOUS

## 2024-05-24 MED ORDER — DIPHENHYDRAMINE HCL 12.5 MG/5ML PO ELIX: 12.5000 mg | ORAL_SOLUTION | Freq: Four times a day (QID) | ORAL | Status: DC | PRN

## 2024-05-24 MED ORDER — ONDANSETRON HCL 4 MG/2ML IJ SOLN
INTRAMUSCULAR | Status: AC
  Filled 2024-05-24: qty 2

## 2024-05-24 MED ORDER — STERILE WATER FOR IRRIGATION IR SOLN
Status: DC | PRN
  Administered 2024-05-24: 1000 mL

## 2024-05-24 MED ORDER — DOCUSATE SODIUM 100 MG PO CAPS
100.0000 mg | ORAL_CAPSULE | Freq: Two times a day (BID) | ORAL | Status: DC
  Administered 2024-05-24 – 2024-05-25 (×2): 100 mg via ORAL
  Filled 2024-05-24 (×2): qty 1

## 2024-05-24 MED ORDER — ROCURONIUM BROMIDE 10 MG/ML (PF) SYRINGE
PREFILLED_SYRINGE | INTRAVENOUS | Status: DC | PRN
  Administered 2024-05-24: 40 mg via INTRAVENOUS
  Administered 2024-05-24: 60 mg via INTRAVENOUS
  Administered 2024-05-24: 30 mg via INTRAVENOUS

## 2024-05-24 MED ORDER — AMISULPRIDE (ANTIEMETIC) 5 MG/2ML IV SOLN: 10.0000 mg | Freq: Once | INTRAVENOUS | Status: DC | PRN

## 2024-05-24 MED ORDER — SUGAMMADEX SODIUM 200 MG/2ML IV SOLN
INTRAVENOUS | Status: DC | PRN
  Administered 2024-05-24: 300 mg via INTRAVENOUS

## 2024-05-24 MED ORDER — ORAL CARE MOUTH RINSE: 15.0000 mL | Freq: Once | OROMUCOSAL | Status: AC

## 2024-05-24 MED ORDER — FLEET ENEMA RE ENEM: 1.0000 | ENEMA | Freq: Once | RECTAL | Status: DC

## 2024-05-24 MED ORDER — LIDOCAINE HCL (PF) 2 % IJ SOLN
INTRAMUSCULAR | Status: AC
  Filled 2024-05-24: qty 5

## 2024-05-24 MED ORDER — FENTANYL CITRATE (PF) 50 MCG/ML IJ SOSY
PREFILLED_SYRINGE | INTRAMUSCULAR | Status: AC
  Filled 2024-05-24: qty 1

## 2024-05-24 MED ORDER — LACTATED RINGERS IV SOLN: INTRAVENOUS | Status: DC

## 2024-05-24 MED ORDER — BUPIVACAINE-EPINEPHRINE (PF) 0.25% -1:200000 IJ SOLN
INTRAMUSCULAR | Status: AC
  Filled 2024-05-24: qty 30

## 2024-05-24 MED ORDER — EPHEDRINE SULFATE (PRESSORS) 25 MG/5ML IV SOSY
PREFILLED_SYRINGE | INTRAVENOUS | Status: DC | PRN
  Administered 2024-05-24 (×2): 10 mg via INTRAVENOUS

## 2024-05-24 MED ORDER — ROCURONIUM BROMIDE 10 MG/ML (PF) SYRINGE
PREFILLED_SYRINGE | INTRAVENOUS | Status: AC
  Filled 2024-05-24: qty 10

## 2024-05-24 MED ORDER — LACTATED RINGERS IV SOLN
INTRAVENOUS | Status: DC | PRN
  Administered 2024-05-24: 1000 mL

## 2024-05-24 MED ORDER — PANTOPRAZOLE SODIUM 40 MG PO TBEC
40.0000 mg | DELAYED_RELEASE_TABLET | Freq: Every day | ORAL | Status: DC
  Administered 2024-05-24 – 2024-05-25 (×2): 40 mg via ORAL
  Filled 2024-05-24 (×2): qty 1

## 2024-05-24 MED ORDER — ONDANSETRON HCL 4 MG/2ML IJ SOLN: 4.0000 mg | INTRAMUSCULAR | Status: DC | PRN

## 2024-05-24 MED ORDER — ONDANSETRON HCL 4 MG/2ML IJ SOLN
INTRAMUSCULAR | Status: DC | PRN
  Administered 2024-05-24: 4 mg via INTRAVENOUS

## 2024-05-24 MED ORDER — CEFAZOLIN SODIUM-DEXTROSE 1-4 GM/50ML-% IV SOLN
1.0000 g | Freq: Three times a day (TID) | INTRAVENOUS | Status: AC
  Administered 2024-05-24 – 2024-05-25 (×2): 1 g via INTRAVENOUS
  Filled 2024-05-24 (×2): qty 50

## 2024-05-24 MED ORDER — HYDROMORPHONE HCL 1 MG/ML IJ SOLN
INTRAMUSCULAR | Status: DC | PRN
  Administered 2024-05-24 (×2): .5 mg via INTRAVENOUS

## 2024-05-24 MED ORDER — TRAMADOL HCL 50 MG PO TABS: 50.0000 mg | ORAL_TABLET | Freq: Four times a day (QID) | ORAL | 0 refills | Status: DC | PRN

## 2024-05-24 MED ORDER — DIPHENHYDRAMINE HCL 50 MG/ML IJ SOLN: 12.5000 mg | Freq: Four times a day (QID) | INTRAMUSCULAR | Status: DC | PRN

## 2024-05-24 MED ORDER — KETOROLAC TROMETHAMINE 15 MG/ML IJ SOLN
15.0000 mg | Freq: Four times a day (QID) | INTRAMUSCULAR | Status: DC
Start: 2024-05-24 — End: 2024-05-26
  Administered 2024-05-24 – 2024-05-25 (×3): 15 mg via INTRAVENOUS
  Filled 2024-05-24 (×4): qty 1

## 2024-05-24 MED ORDER — TRIPLE ANTIBIOTIC 3.5-400-5000 EX OINT: 1.0000 | TOPICAL_OINTMENT | Freq: Three times a day (TID) | CUTANEOUS | Status: DC | PRN

## 2024-05-24 MED ORDER — GABAPENTIN 300 MG PO CAPS
300.0000 mg | ORAL_CAPSULE | Freq: Once | ORAL | Status: AC
  Administered 2024-05-24: 300 mg via ORAL
  Filled 2024-05-24: qty 1

## 2024-05-24 MED ORDER — HEPARIN SODIUM (PORCINE) 1000 UNIT/ML IJ SOLN
INTRAMUSCULAR | Status: AC
  Filled 2024-05-24: qty 1

## 2024-05-24 MED ORDER — DOCUSATE SODIUM 100 MG PO CAPS: 100.0000 mg | ORAL_CAPSULE | Freq: Two times a day (BID) | ORAL | Status: DC

## 2024-05-24 MED ORDER — CHLORHEXIDINE GLUCONATE 0.12 % MT SOLN
15.0000 mL | Freq: Once | OROMUCOSAL | Status: AC
  Administered 2024-05-24: 15 mL via OROMUCOSAL

## 2024-05-24 MED ORDER — MORPHINE SULFATE (PF) 2 MG/ML IV SOLN: 2.0000 mg | INTRAVENOUS | Status: DC | PRN

## 2024-05-24 MED ORDER — MAGNESIUM CITRATE PO SOLN: 1.0000 | Freq: Once | ORAL | Status: DC

## 2024-05-24 MED ORDER — DEXAMETHASONE SOD PHOSPHATE PF 10 MG/ML IJ SOLN
INTRAMUSCULAR | Status: DC | PRN
  Administered 2024-05-24: 10 mg via INTRAVENOUS

## 2024-05-24 MED ORDER — SULFAMETHOXAZOLE-TRIMETHOPRIM 800-160 MG PO TABS: 1.0000 | ORAL_TABLET | Freq: Two times a day (BID) | ORAL | 0 refills | Status: DC

## 2024-05-24 MED ORDER — KCL IN DEXTROSE-NACL 20-5-0.45 MEQ/L-%-% IV SOLN
INTRAVENOUS | Status: DC
  Filled 2024-05-24 (×3): qty 1000

## 2024-05-24 MED ORDER — FENTANYL CITRATE (PF) 50 MCG/ML IJ SOSY
25.0000 ug | PREFILLED_SYRINGE | INTRAMUSCULAR | Status: DC | PRN
  Administered 2024-05-24: 50 ug via INTRAVENOUS

## 2024-05-24 MED ORDER — FENTANYL CITRATE (PF) 100 MCG/2ML IJ SOLN
INTRAMUSCULAR | Status: AC
  Filled 2024-05-24: qty 2

## 2024-05-24 MED ORDER — FENTANYL CITRATE (PF) 250 MCG/5ML IJ SOLN
INTRAMUSCULAR | Status: DC | PRN
  Administered 2024-05-24: 100 ug via INTRAVENOUS

## 2024-05-24 MED ORDER — ONDANSETRON HCL 4 MG/2ML IJ SOLN: 4.0000 mg | Freq: Once | INTRAMUSCULAR | Status: DC | PRN

## 2024-05-24 MED ORDER — BUPIVACAINE-EPINEPHRINE 0.25% -1:200000 IJ SOLN
INTRAMUSCULAR | Status: DC | PRN
  Administered 2024-05-24: 30 mL

## 2024-05-24 MED ORDER — HYDROMORPHONE HCL 2 MG/ML IJ SOLN
INTRAMUSCULAR | Status: AC
  Filled 2024-05-24: qty 1

## 2024-05-24 MED ORDER — CEFAZOLIN SODIUM-DEXTROSE 2-4 GM/100ML-% IV SOLN
2.0000 g | INTRAVENOUS | Status: AC
  Administered 2024-05-24: 2 g via INTRAVENOUS
  Filled 2024-05-24: qty 100

## 2024-05-24 MED ORDER — PROPOFOL 10 MG/ML IV BOLUS
INTRAVENOUS | Status: AC
  Filled 2024-05-24: qty 20

## 2024-05-24 MED ORDER — ZOLPIDEM TARTRATE 5 MG PO TABS: 5.0000 mg | ORAL_TABLET | Freq: Every evening | ORAL | Status: DC | PRN

## 2024-05-24 MED ORDER — MIDAZOLAM HCL (PF) 2 MG/2ML IJ SOLN
INTRAMUSCULAR | Status: DC | PRN
  Administered 2024-05-24: 2 mg via INTRAVENOUS

## 2024-05-24 MED ORDER — MIDAZOLAM HCL 2 MG/2ML IJ SOLN
INTRAMUSCULAR | Status: AC
  Filled 2024-05-24: qty 2

## 2024-05-24 MED ORDER — PROPOFOL 10 MG/ML IV BOLUS
INTRAVENOUS | Status: DC | PRN
  Administered 2024-05-24: 170 mg via INTRAVENOUS

## 2024-05-24 MED ORDER — LIDOCAINE 2% (20 MG/ML) 5 ML SYRINGE
INTRAMUSCULAR | Status: DC | PRN
  Administered 2024-05-24: 80 mg via INTRAVENOUS

## 2024-05-24 MED ORDER — ROCURONIUM BROMIDE 10 MG/ML (PF) SYRINGE
PREFILLED_SYRINGE | INTRAVENOUS | Status: AC
  Filled 2024-05-24: qty 20

## 2024-05-24 SURGICAL SUPPLY — 53 items
APPLICATOR COTTON TIP 6 STRL (MISCELLANEOUS) ×2 IMPLANT
BAG COUNTER SPONGE SURGICOUNT (BAG) IMPLANT
CATH FOLEY 2WAY SLVR 18FR 30CC (CATHETERS) ×2 IMPLANT
CATH ROBINSON RED A/P 16FR (CATHETERS) ×2 IMPLANT
CATH ROBINSON RED A/P 8FR (CATHETERS) ×2 IMPLANT
CATH TIEMANN FOLEY 18FR 5CC (CATHETERS) ×2 IMPLANT
CHLORAPREP W/TINT 26 (MISCELLANEOUS) ×2 IMPLANT
CLIP LIGATING HEM O LOK PURPLE (MISCELLANEOUS) ×2 IMPLANT
COVER SURGICAL LIGHT HANDLE (MISCELLANEOUS) ×2 IMPLANT
COVER TIP SHEARS 8 DVNC (MISCELLANEOUS) ×2 IMPLANT
CUTTER ECHEON FLEX ENDO 45 340 (ENDOMECHANICALS) ×2 IMPLANT
DERMABOND ADVANCED .7 DNX12 (GAUZE/BANDAGES/DRESSINGS) ×2 IMPLANT
DRAPE ARM DVNC X/XI (DISPOSABLE) ×8 IMPLANT
DRAPE COLUMN DVNC XI (DISPOSABLE) ×2 IMPLANT
DRAPE SURG IRRIG POUCH 19X23 (DRAPES) ×2 IMPLANT
DRIVER NDL LRG 8 DVNC XI (INSTRUMENTS) ×4 IMPLANT
DRIVER NDLE LRG 8 DVNC XI (INSTRUMENTS) ×4 IMPLANT
DRSG TEGADERM 4X4.75 (GAUZE/BANDAGES/DRESSINGS) ×2 IMPLANT
ELECT PENCIL ROCKER SW 15FT (MISCELLANEOUS) ×2 IMPLANT
ELECT REM PT RETURN 15FT ADLT (MISCELLANEOUS) ×2 IMPLANT
FORCEPS BPLR LNG DVNC XI (INSTRUMENTS) ×2 IMPLANT
FORCEPS PROGRASP DVNC XI (FORCEP) ×2 IMPLANT
GAUZE SPONGE 4X4 12PLY STRL (GAUZE/BANDAGES/DRESSINGS) ×2 IMPLANT
GLOVE BIO SURGEON STRL SZ 6.5 (GLOVE) ×2 IMPLANT
GLOVE SURG LX STRL 7.5 STRW (GLOVE) ×4 IMPLANT
GOWN STRL REUS W/ TWL XL LVL3 (GOWN DISPOSABLE) ×4 IMPLANT
GOWN STRL SURGICAL XL XLNG (GOWN DISPOSABLE) ×2 IMPLANT
HOLDER FOLEY CATH W/STRAP (MISCELLANEOUS) ×2 IMPLANT
IRRIGATION SUCT STRKRFLW 2 WTP (MISCELLANEOUS) ×2 IMPLANT
IV LACTATED RINGERS 1000ML (IV SOLUTION) ×2 IMPLANT
KIT TURNOVER KIT A (KITS) ×2 IMPLANT
NDL SAFETY ECLIPSE 18X1.5 (NEEDLE) IMPLANT
PACK ROBOT UROLOGY CUSTOM (CUSTOM PROCEDURE TRAY) ×2 IMPLANT
PLUG CATH AND CAP STRL 200 (CATHETERS) IMPLANT
RELOAD STAPLE 45 4.1 GRN THCK (STAPLE) ×2 IMPLANT
SCISSORS LAP 5X45 EPIX DISP (ENDOMECHANICALS) IMPLANT
SCISSORS MNPLR CVD DVNC XI (INSTRUMENTS) ×2 IMPLANT
SEAL UNIV 5-12 XI (MISCELLANEOUS) ×8 IMPLANT
SET TUBE SMOKE EVAC HIGH FLOW (TUBING) ×2 IMPLANT
SOL PREP POV-IOD 4OZ 10% (MISCELLANEOUS) ×2 IMPLANT
SOLUTION ELECTROSURG ANTI STCK (MISCELLANEOUS) ×2 IMPLANT
SPIKE FLUID TRANSFER (MISCELLANEOUS) ×2 IMPLANT
SUT ETHILON 3 0 PS 1 (SUTURE) ×2 IMPLANT
SUT MNCRL 3 0 RB1 (SUTURE) ×2 IMPLANT
SUT MNCRL 3 0 VIOLET RB1 (SUTURE) ×2 IMPLANT
SUT MNCRL AB 4-0 PS2 18 (SUTURE) ×4 IMPLANT
SUT PDS PLUS AB 0 CT-2 (SUTURE) ×4 IMPLANT
SUT VIC AB 0 CT1 27XBRD ANTBC (SUTURE) ×4 IMPLANT
SUT VIC AB 2-0 SH 27X BRD (SUTURE) ×2 IMPLANT
SUT VIC AB 3-0 SH 27X BRD (SUTURE) IMPLANT
SYR 27GX1/2 1ML LL SAFETY (SYRINGE) ×2 IMPLANT
TROCAR Z THREAD OPTICAL 12X100 (TROCAR) IMPLANT
WATER STERILE IRR 1000ML POUR (IV SOLUTION) ×2 IMPLANT

## 2024-05-24 NOTE — Anesthesia Procedure Notes (Signed)
 Procedure Name: Intubation Date/Time: 05/24/2024 10:52 AM  Performed by: Nanci Riis, CRNAPre-anesthesia Checklist: Patient identified, Emergency Drugs available, Suction available, Patient being monitored and Timeout performed Patient Re-evaluated:Patient Re-evaluated prior to induction Oxygen Delivery Method: Circle system utilized Preoxygenation: Pre-oxygenation with 100% oxygen Induction Type: IV induction Ventilation: Mask ventilation without difficulty Laryngoscope Size: Miller and 3 Grade View: Grade I Tube type: Oral Tube size: 7.5 mm Number of attempts: 1 Airway Equipment and Method: Stylet Placement Confirmation: ETT inserted through vocal cords under direct vision, positive ETCO2 and breath sounds checked- equal and bilateral Secured at: 21 cm Tube secured with: Tape Dental Injury: Teeth and Oropharynx as per pre-operative assessment

## 2024-05-24 NOTE — Progress Notes (Signed)
 Patient ID: Geoffrey Bishop, male   DOB: 15-Jul-1956, 68 y.o.   MRN: [id number]  Post-op note  Subjective: The patient is doing well.  No complaints.  Objective: Vital signs in last 24 hours: Temp:  [96.6 F (35.9 C)-99 F (37.2 C)] 97.4 F (36.3 C) (11/10 1445) Pulse Rate:  [56-74] 74 (11/10 1500) Resp:  [11-18] 13 (11/10 1500) BP: (109-141)/(66-85) 141/85 (11/10 1500) SpO2:  [94 %-98 %] 96 % (11/10 1500) Weight:  [80.3 kg] 80.3 kg (11/10 0939)  Intake/Output from previous day: No intake/output data recorded. Intake/Output this shift: Total I/O In: 2600 [I.V.:1600; IV Piggyback:1000] Out: 250 [Blood:250]  Physical Exam:  General: Alert and oriented. Abdomen: Soft, Nondistended. Incisions: Clean and dry.  Lab Results: Recent Labs    05/24/24 1422  HGB 14.0  HCT 44.0    Assessment/Plan: POD#0   1) Continue to monitor, ambulate, IS   [name] MD   LOS: 0 days   [name] 05/24/2024, 3:27 PM

## 2024-05-24 NOTE — Op Note (Signed)
 Preoperative diagnosis: Clinically localized adenocarcinoma of the prostate (clinical stage T1c N0 M0)  Postoperative diagnosis: Clinically localized adenocarcinoma of the prostate (clinical stage T1c N0 M0)  Procedure:  Robotic assisted laparoscopic radical prostatectomy (bilateral nerve sparing) Bilateral robotic assisted laparoscopic pelvic lymphadenectomy  Surgeon: [name] M.D.  Assistant: Alan Hammonds, PA-C  An assistant was required for this surgical procedure.  The duties of the assistant included but were not limited to suctioning, passing suture, camera manipulation, retraction. This procedure would not be able to be performed without an geophysicist/field seismologist.  Anesthesia: General  Complications: None  EBL: 250 mL  IVF:  1600 mL crystalloid  Specimens: Prostate and seminal vesicles Right pelvic lymph nodes Left pelvic lymph nodes  Disposition of specimens: Pathology  Drains: 20 Fr coude catheter # 19 Blake pelvic drain  Indication: Geoffrey Bishop is a 68 y.o. year old patient with clinically localized prostate cancer.  After a thorough review of the management options for treatment of prostate cancer, he elected to proceed with surgical therapy and the above procedure(s).  We have discussed the potential benefits and risks of the procedure, side effects of the proposed treatment, the likelihood of the patient achieving the goals of the procedure, and any potential problems that might occur during the procedure or recuperation. Informed consent has been obtained.  Description of procedure:  The patient was taken to the operating room and a general anesthetic was administered. He was given preoperative antibiotics, placed in the dorsal lithotomy position, and prepped and draped in the usual sterile fashion. Next a preoperative timeout was performed. A urethral catheter was placed into the bladder and a site was selected near the umbilicus for placement of the camera  port. This was placed using a standard open Hassan technique which allowed entry into the peritoneal cavity under direct vision and without difficulty. An 8 mm robotic port was placed and a pneumoperitoneum established. The camera was then used to inspect the abdomen and there was no evidence of any intra-abdominal injuries or other abnormalities. The remaining abdominal ports were then placed. 8 mm robotic ports were placed in the right lower quadrant, left lower quadrant, and far left lateral abdominal wall. A 5 mm port was placed in the right upper quadrant and a 12 mm port was placed in the right lateral abdominal wall for laparoscopic assistance. All ports were placed under direct vision without difficulty. The surgical cart was then docked.   Utilizing the cautery scissors, the bladder was reflected posteriorly allowing entry into the space of Retzius and identification of the endopelvic fascia and prostate. The periprostatic fat was then removed from the prostate allowing full exposure of the endopelvic fascia. The endopelvic fascia was then incised from the apex back to the base of the prostate bilaterally and the underlying levator muscle fibers were swept laterally off the prostate thereby isolating the dorsal venous complex. The dorsal vein was then stapled and divided with a 45 mm Flex Echelon stapler. Attention then turned to the bladder neck which was divided anteriorly thereby allowing entry into the bladder and exposure of the urethral catheter. The catheter balloon was deflated and the catheter was brought into the operative field and used to retract the prostate anteriorly. The posterior bladder neck was then examined and was divided allowing further dissection between the bladder and prostate posteriorly until the vasa deferentia and seminal vessels were identified. The vasa deferentia were isolated, divided, and lifted anteriorly. The seminal vesicles were dissected down to their  tips with care  to control the seminal vascular arterial blood supply. These structures were then lifted anteriorly and the space between Denonvillier's fascia and the anterior rectum was developed with a combination of sharp and blunt dissection. This isolated the vascular pedicles of the prostate.  The lateral prostatic fascia was then sharply incised allowing release of the neurovascular bundles bilaterally. The vascular pedicles of the prostate were then ligated with Weck clips between the prostate and neurovascular bundles and divided with sharp cold scissor dissection resulting in neurovascular bundle preservation. The neurovascular bundles were then separated off the apex of the prostate and urethra bilaterally.  The urethra was then sharply transected allowing the prostate specimen to be disarticulated. The pelvis was copiously irrigated and hemostasis was ensured. There was no evidence for rectal injury.  Attention then turned to the right pelvic sidewall. The fibrofatty tissue between the external iliac vein, confluence of the iliac vessels, hypogastric artery, and Cooper's ligament was dissected free from the pelvic sidewall with care to preserve the obturator nerve. Weck clips were used for lymphostasis and hemostasis. An identical procedure was performed on the contralateral side and the lymphatic packets were removed for permanent pathologic analysis.  Attention then turned to the urethral anastomosis. A 2-0 Vicryl slip knot was placed between Denonvillier's fascia, the posterior bladder neck, and the posterior urethra to reapproximate these structures. A double-armed 3-0 Monocryl suture was then used to perform a 360 running tension-free anastomosis between the bladder neck and urethra. A new urethral catheter was then placed into the bladder and irrigated. There were no blood clots within the bladder and the anastomosis appeared to be watertight. A #19 Blake drain was then brought through the left lateral 8  mm port site and positioned appropriately within the pelvis. It was secured to the skin with a nylon suture. The surgical cart was then undocked. The right lateral 12 mm port site was closed at the fascial level with a 0 Vicryl suture placed laparoscopically. All remaining ports were then removed under direct vision. The prostate specimen was removed intact within the Endopouch retrieval bag via the periumbilical camera port site. This fascial opening was closed with two running 0 PDS sutures. 0.25% Marcaine was then injected into all port sites and all incisions were reapproximated at the skin level with 4-0 Monocryl subcuticular sutures and Dermabond. The patient appeared to tolerate the procedure well and without complications. The patient was able to be extubated and transferred to the recovery unit in satisfactory condition.   Gretel CANDIE Renda [name]

## 2024-05-24 NOTE — Discharge Instructions (Signed)

## 2024-05-24 NOTE — Anesthesia Postprocedure Evaluation (Signed)
 Anesthesia Post Note  Patient: Geoffrey Bishop  Procedure(s) Performed: PROSTATECTOMY, RADICAL, ROBOT-ASSISTED, LAPAROSCOPIC (Abdomen) LYMPHADENECTOMY, PELVIS, ROBOT-ASSISTED (Bilateral)     Patient location during evaluation: Nursing Unit Anesthesia Type: General Level of consciousness: awake and alert Pain management: pain level controlled Vital Signs Assessment: post-procedure vital signs reviewed and stable Respiratory status: spontaneous breathing, nonlabored ventilation and respiratory function stable Cardiovascular status: blood pressure returned to baseline and stable Postop Assessment: no apparent nausea or vomiting Anesthetic complications: no   No notable events documented.  Last Vitals:  Vitals:   05/24/24 1500 05/24/24 1532  BP: (!) 141/85 (!) 145/79  Pulse: 74 72  Resp: 13 17  Temp:  36.6 C  SpO2: 96% 97%    Last Pain:  Vitals:   05/24/24 1542  TempSrc:   PainSc: 3                  Garnette FORBES Skillern

## 2024-05-24 NOTE — H&P (Signed)
 H&P  Chief Complaint: Prostate cancer  History of Present Illness:   Geoffrey Bishop is a pleasant 68 year old gentleman with a history of an elevated PSA status post prostate biopsy in October 2023 that demonstrates atypical Lans without malignancy. PSA further increased to 7.5 earlier this year prompting MRI of the prostate in March that demonstrated 1 small PI-RADS 4 lesion. He underwent an MR/ultrasound fusion biopsy Dr. 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 All 3 targeted cores were negative.  Family history: None.  Imaging studies: MRI (01/21/2024): No EPE, SVI, LAD, or bone lesions. PSMA PET scan (02/19/2024): Local uptake within the prostate without evidence of metastatic disease.  PMH: He has a history of GERD. PSH: No abdominal surgeries.  TNM stage: cT1c N0 M0 PSA: 7.5 Gleason score: 4+4 = 8 (grade group 4) Biopsy (01/21/2024): 1/15 cores positive Left: Left lateral apex (20%, 4+4 = 8) Right: Benign ROI: Benign Prostate volume: 42 cc  Urinary function: IPSS is 19. Erectile function: SHIM score is 16.  Past Medical History:  Diagnosis Date   BPH (benign prostatic hyperplasia)    History of kidney stones    Neuropathy     Past Surgical History:  Procedure Laterality Date   COLONOSCOPY     EYE SURGERY  2022   JOINT REPLACEMENT  2019   left knee    Home Medications:  No current facility-administered medications on file prior to encounter.   Current Outpatient Medications on File Prior to Encounter  Medication Sig Dispense Refill   cholecalciferol (VITAMIN D3) 25 MCG (1000 UNIT) tablet Take 1,000 Units by mouth once a week.     Collagen-Vitamin C-Biotin (COLLAGEN PO) Take 1 capsule by mouth daily.     TURMERIC PO Take 1 capsule by mouth once a week.     vitamin B-12 (CYANOCOBALAMIN ) 100 MCG tablet Take 100 mcg by mouth once a week.     lansoprazole (PREVACID) 30 MG capsule Take 1 capsule (30 mg total) by  mouth daily as needed (GERD flareups (protonix  not covered so this is a replacement)). (Patient not taking: Reported on 05/11/2024) 90 capsule 0   vardenafil  (LEVITRA ) 20 MG tablet Take 1 tablet (20 mg total) by mouth daily as needed for erectile dysfunction. (Patient not taking: Reported on 05/11/2024) 30 tablet PRN     Allergies: No Known Allergies  Family History  Problem Relation Age of Onset   Heart disease Mother    Alcohol abuse Mother    Alcohol abuse Father    Lung cancer Brother     Social History:  reports that he has never smoked. He has never used smokeless tobacco. He reports that he does not drink alcohol and does not use drugs.  ROS: A complete review of systems was performed.  All systems are negative except for pertinent findings as noted.  Physical Exam:  Vital signs in last 24 hours: Temp:  [99 F (37.2 C)] 99 F (37.2 C) (11/10 0939) Pulse Rate:  [56] 56 (11/10 0939) Resp:  [18] 18 (11/10 0939) BP: (116)/(77) 116/77 (11/10 0939) SpO2:  [94 %] 94 % (11/10 0939) Weight:  [80.3 kg] 80.3 kg (11/10 0939) Constitutional:  Alert and oriented, No acute distress Cardiovascular:No JVD Respiratory: Normal respiratory effort Lymphatic: No lymphadenopathy Neurologic: Grossly intact, no focal deficits Psychiatric: Normal mood and affect   Laboratory Data:  No results for input(s): WBC, HGB, HCT, PLT in the last 72 hours.  No results  for input(s): NA, K, CL, GLUCOSE, BUN, CALCIUM, CREATININE in the last 72 hours.  Invalid input(s): CO3   No results found for this or any previous visit (from the past 24 hours). No results found for this or any previous visit (from the past 240 hours).  Renal Function: No results for input(s): CREATININE in the last 168 hours. Estimated Creatinine Clearance: 68.6 mL/min (by C-G formula based on SCr of 1.04 mg/dL).  Radiologic Imaging: No results found.  Impression/Assessment:  Prostate  cancer  Plan:  He has elected to proceed with surgical therapy and a RAL radical prostatectomy and BPLND as outlined in my office note.  I discussed the potential benefits and risks of the procedure, side effects of the proposed treatment, the likelihood of the patient achieving the goals of the procedure, and any potential problems that might occur during the procedure or recuperation.  He gives informed consent to proceed.   Geoffrey Bishop 05/24/2024, 10:14 AM  Geoffrey CANDIE Bishop [name]

## 2024-05-24 NOTE — Transfer of Care (Signed)
 Immediate Anesthesia Transfer of Care Note  Patient: Syd Newsome  Procedure(s) Performed: PROSTATECTOMY, RADICAL, ROBOT-ASSISTED, LAPAROSCOPIC (Abdomen) LYMPHADENECTOMY, PELVIS, ROBOT-ASSISTED (Bilateral)  Patient Location: PACU  Anesthesia Type:General  Level of Consciousness: drowsy and patient cooperative  Airway & Oxygen Therapy: Patient Spontanous Breathing and Patient connected to face mask oxygen  Post-op Assessment: Report given to RN and Post -op Vital signs reviewed and stable  Post vital signs: Reviewed and stable  Last Vitals:  Vitals Value Taken Time  BP 128/86 05/24/24 13:34  Temp    Pulse 73 05/24/24 13:35  Resp 16 05/24/24 13:35  SpO2 97 % 05/24/24 13:35  Vitals shown include unfiled device data.  Last Pain:  Vitals:   05/24/24 0939  TempSrc: Oral  PainSc: 0-No pain         Complications: No notable events documented.

## 2024-05-24 NOTE — Anesthesia Preprocedure Evaluation (Addendum)
 Anesthesia Evaluation  Patient identified by MRN, date of birth, ID band Patient awake    Reviewed: Allergy & Precautions, NPO status , Patient's Chart, lab work & pertinent test results  Airway Mallampati: II  TM Distance: >3 FB Neck ROM: Full    Dental  (+) Teeth Intact, Dental Advisory Given   Pulmonary neg pulmonary ROS   Pulmonary exam normal breath sounds clear to auscultation       Cardiovascular negative cardio ROS Normal cardiovascular exam Rhythm:Regular Rate:Normal     Neuro/Psych  PROSTATE CANCER  Neuromuscular disease    GI/Hepatic Neg liver ROS,GERD  Medicated,,  Endo/Other  negative endocrine ROS    Renal/GU negative Renal ROS     Musculoskeletal negative musculoskeletal ROS (+)    Abdominal   Peds  Hematology negative hematology ROS (+)   Anesthesia Other Findings Day of surgery medications reviewed with the patient.  Reproductive/Obstetrics                              Anesthesia Physical Anesthesia Plan  ASA: 2  Anesthesia Plan: General   Post-op Pain Management: Tylenol PO (pre-op)* and Gabapentin PO (pre-op)*   Induction: Intravenous  PONV Risk Score and Plan: 3 and Midazolam, Dexamethasone and Ondansetron  Airway Management Planned: Oral ETT  Additional Equipment: ClearSight  Intra-op Plan:   Post-operative Plan: Extubation in OR  Informed Consent: I have reviewed the patients History and Physical, chart, labs and discussed the procedure including the risks, benefits and alternatives for the proposed anesthesia with the patient or authorized representative who has indicated his/her understanding and acceptance.     Dental advisory given  Plan Discussed with: CRNA  Anesthesia Plan Comments: (2nd PIV after induction.)         Anesthesia Quick Evaluation

## 2024-05-25 ENCOUNTER — Encounter (HOSPITAL_COMMUNITY): Payer: Self-pay | Admitting: Urology

## 2024-05-25 DIAGNOSIS — C61 Malignant neoplasm of prostate: Secondary | ICD-10-CM | POA: Diagnosis not present

## 2024-05-25 LAB — HEMOGLOBIN AND HEMATOCRIT, BLOOD
HCT: 37.5 % — ABNORMAL LOW (ref 39.0–52.0)
Hemoglobin: 12.1 g/dL — ABNORMAL LOW (ref 13.0–17.0)

## 2024-05-25 MED ORDER — TRAMADOL HCL 50 MG PO TABS: 50.0000 mg | ORAL_TABLET | Freq: Four times a day (QID) | ORAL | Status: DC | PRN

## 2024-05-25 MED ORDER — BISACODYL 10 MG RE SUPP
10.0000 mg | Freq: Once | RECTAL | Status: DC
  Filled 2024-05-25: qty 1

## 2024-05-25 NOTE — Progress Notes (Signed)
 Patient ID: Geoffrey Bishop, male   DOB: December 22, 1955, 68 y.o.   MRN: [id number] 1 Day Post-Op Subjective: The patient is doing well.  No nausea or vomiting. Pain is adequately controlled.  Objective: Vital signs in last 24 hours: Temp:  [96.6 F (35.9 C)-99 F (37.2 C)] 98.7 F (37.1 C) (11/11 0347) Pulse Rate:  [56-85] 77 (11/11 0347) Resp:  [11-19] 19 (11/11 0347) BP: (109-149)/(65-85) 118/68 (11/11 0347) SpO2:  [92 %-98 %] 93 % (11/11 0347) Weight:  [80.3 kg] 80.3 kg (11/10 0939)  Intake/Output from previous day: 11/10 0701 - 11/11 0700 In: 5074.3 [P.O.:360; I.V.:3690.8; IV Piggyback:1023.5] Out: 2300 [Urine:1850; Drains:200; Blood:250] Intake/Output this shift: No intake/output data recorded.  Physical Exam:  General: Alert and oriented. CV: RRR Lungs: Clear bilaterally. GI: Soft, Nondistended. Incisions: Clean, dry, and intact Urine: Clear Extremities: Nontender, no erythema, no edema.  Lab Results: Recent Labs    05/24/24 1422 05/25/24 0547  HGB 14.0 12.1*  HCT 44.0 37.5*      Assessment/Plan: POD# 1 s/p robotic prostatectomy.  1) SL IVF 2) Ambulate, Incentive spirometry 3) Transition to oral pain medication 4) Dulcolax suppository 5) D/C pelvic drain 6) Plan for likely discharge later today   Geoffrey Bishop. MD   LOS: 0 days   Geoffrey Bishop 05/25/2024, 7:57 AM

## 2024-05-25 NOTE — Care Management Obs Status (Signed)
 MEDICARE OBSERVATION STATUS NOTIFICATION   Patient Details  Name: Geoffrey Bishop MRN: [id number] Date of Birth: 1955-07-19   Medicare Observation Status Notification Given:  Yes    MahabirNathanel, RN 05/25/2024, 11:26 AM

## 2024-05-25 NOTE — Progress Notes (Signed)
 AVS reviewed w/ patient who verbalized an understanding. Foley teaching reviewed with patient who verbalized an understanding. Foley had been changed to a leg bag by another [facility]. PIV removed as noted. Patient dressed for home - to lobby via w/c - home with family.

## 2024-05-25 NOTE — TOC Initial Note (Signed)
 Transition of Care [facility]) - Initial/Assessment Note    Patient Details  Name: Geoffrey Bishop MRN: [id number] Date of Birth: [birth year]  Transition of Care [facility]) CM/SW Contact:    Bascom Service, RN Phone Number: 05/25/2024, 11:27 AM  Clinical Narrative:d/c home no CM needs.                   Expected Discharge Plan: Home/Self Care Barriers to Discharge: Continued Medical Work up   Patient Goals and CMS Choice Patient states their goals for this hospitalization and ongoing recovery are:: Home          Expected Discharge Plan and Services                                              Prior Living Arrangements/Services                       Activities of Daily Living   ADL Screening (condition at time of admission) Independently performs ADLs?: Yes (appropriate for developmental age) Is the patient deaf or have difficulty hearing?: No Does the patient have difficulty seeing, even when wearing glasses/contacts?: No Does the patient have difficulty concentrating, remembering, or making decisions?: No  Permission Sought/Granted                  Emotional Assessment              Admission diagnosis:  Prostate cancer [facility]) [C61] Patient Active Problem List   Diagnosis Date Noted   Prostate cancer (HCC) 05/24/2024   Erectile dysfunction 09/10/2023   Elevated PSA 05/03/2022   Pseudophakia of right eye 10/04/2021   Posterior capsular opacification of right eye, not obscuring vision 10/04/2021   Combined forms of age-related cataract of left eye 10/04/2021   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 [place] [address] Phone: [phone] Fax: [phone]     Social Drivers of Health (SDOH) Social History: SDOH Screenings   Food Insecurity: No Food Insecurity (05/24/2024)  Housing: Low Risk  (05/24/2024)  Transportation Needs: No Transportation Needs (05/24/2024)  Utilities: Not At Risk (05/24/2024)  Alcohol Screen: Low Risk  (03/01/2024)  Depression (PHQ2-9): Low Risk  (03/01/2024)  Financial Resource Strain: Low Risk  (08/11/2023)  Physical Activity: Insufficiently Active (08/11/2023)  Social Connections: Moderately Integrated (05/24/2024)  Stress: No Stress Concern Present (08/11/2023)  Tobacco Use: Low Risk  (05/24/2024)  Health Literacy: Adequate Health Literacy (03/01/2024)   SDOH Interventions:     Readmission Risk Interventions     No data to display

## 2024-05-25 NOTE — Discharge Summary (Signed)
  Date of admission: 05/24/2024  Date of discharge: 05/25/2024  Admission diagnosis: Prostate Cancer  Discharge diagnosis: Prostate Cancer  History and Physical: For full details, please see admission history and physical. Briefly, Geoffrey Bishop is a 68 y.o. gentleman with localized prostate cancer.  After discussing management/treatment options, he elected to proceed with surgical treatment.  Hospital Course: Ashutosh Dieguez was taken to the operating room on 05/24/2024 and underwent a robotic assisted laparoscopic radical prostatectomy. He tolerated this procedure well and without complications. Postoperatively, he was able to be transferred to a regular hospital room following recovery from anesthesia.  He was able to begin ambulating the night of surgery. He remained hemodynamically stable overnight.  He had excellent urine output with appropriately minimal output from his pelvic drain and his pelvic drain was removed on POD #1.  He was transitioned to oral pain medication, tolerated a clear liquid diet, and had met all discharge criteria and was able to be discharged home later on POD#1.  Laboratory values:  Recent Labs    05/24/24 1422 05/25/24 0547  HGB 14.0 12.1*  HCT 44.0 37.5*    Disposition: Home  Discharge instruction: He was instructed to be ambulatory but to refrain from heavy lifting, strenuous activity, or driving. He was instructed on urethral catheter care.  Discharge medications:   Allergies as of 05/25/2024       Reactions   Porcine (pork) Protein-containing Drug Products    Religious reasons        Medication List     STOP taking these medications    cholecalciferol 25 MCG (1000 UNIT) tablet Commonly known as: VITAMIN D3   COLLAGEN PO   TURMERIC PO   vitamin B-12 100 MCG tablet Commonly known as: CYANOCOBALAMIN        TAKE these medications    docusate sodium 100 MG capsule Commonly known as: COLACE Take 1 capsule (100 mg total) by  mouth 2 (two) times daily.   lansoprazole 30 MG capsule Commonly known as: PREVACID Take 1 capsule (30 mg total) by mouth daily as needed (GERD flareups (protonix  not covered so this is a replacement)).   sulfamethoxazole-trimethoprim 800-160 MG tablet Commonly known as: BACTRIM DS Take 1 tablet by mouth 2 (two) times daily. Start the day prior to foley removal appointment   traMADol 50 MG tablet Commonly known as: Ultram Take 1-2 tablets (50-100 mg total) by mouth every 6 (six) hours as needed for moderate pain (pain score 4-6) or severe pain (pain score 7-10).   vardenafil  20 MG tablet Commonly known as: LEVITRA  Take 1 tablet (20 mg total) by mouth daily as needed for erectile dysfunction.        Followup: He will followup in 1 week for catheter removal and to discuss his surgical pathology results.

## 2024-05-28 LAB — SURGICAL PATHOLOGY

## 2024-06-07 ENCOUNTER — Other Ambulatory Visit: Payer: Self-pay

## 2024-08-12 DIAGNOSIS — E7800 Pure hypercholesterolemia, unspecified: Secondary | ICD-10-CM | POA: Insufficient documentation

## 2024-08-12 NOTE — Progress Notes (Signed)
 "  Geoffrey Bishop is a 69 y.o. male presents to office today for annual physical exam examination.    Overall doing well since his prostatectomy in November.  He has had some urinary leakage but is actively engaged with pelvic floor rehabilitation and seems to be doing better.  Denies any urinary incontinence.  No pelvic pain.  He continues to exercise as able, sometimes his time is limited due to his wife's battle with cancer.  He has excellent support by his children and mosque.  He continues to try and lead a very balanced lifestyle but admits he probably does not drink enough fluids due to multiple life stressors right now.  He does not desire any additional counseling or medications and feels like he is managing these issues well.   Occupation: retired, Marital status: married, Substance use: none There are no preventive care reminders to display for this patient.   Immunization History  Administered Date(s) Administered   Moderna Sars-Covid-2 Vaccination 09/22/2019, 10/20/2019, 05/10/2020   Td 04/26/2022   Tdap 07/15/1997, 08/31/2007   Tetanus Immune Globulin 07/15/1997   Zoster Recombinant(Shingrix) 05/16/2021, 08/08/2021   Past Medical History:  Diagnosis Date   BPH (benign prostatic hyperplasia)    History of kidney stones    Neuropathy    Social History   Socioeconomic History   Marital status: Married    Spouse name: Rojelio   Number of children: 2   Years of education: Not on file   Highest education level: Bachelor's degree (e.g., BA, AB, BS)  Occupational History   Occupation: retired     Comment: used to be a production designer, theatre/television/film at [employer]  Tobacco Use   Smoking status: Never   Smokeless tobacco: Never  Vaping Use   Vaping status: Never Used  Substance and Sexual Activity   Alcohol use: Never   Drug use: Never   Sexual activity: Yes    Birth control/protection: Condom, None  Other Topics Concern   Not on file  Social History Narrative   Very pleasant gentleman who  retired from a restaurant manager, 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  They have 2 children, an adopted son and a daughter who reside in [place].   Patient enjoys being physically active.  He tries to maintain health through proper diet, exercise and herbal remedies.   He practices Islam and is well supported by his mosque and family.       Social Drivers of Health   Tobacco Use: Low Risk (08/13/2024)   Patient History    Smoking Tobacco Use: Never    Smokeless Tobacco Use: Never    Passive Exposure: Not on file  Financial Resource Strain: Low Risk (08/11/2023)   Overall Financial Resource Strain (CARDIA)    Difficulty of Paying Living Expenses: Not hard at all  Food Insecurity: No Food Insecurity (05/24/2024)   Epic    Worried About Programme Researcher, Broadcasting/film/video in the Last Year: Never true    Ran Out of Food in the Last Year: Never true  Transportation Needs: No Transportation Needs (05/24/2024)   Epic    Lack of Transportation (Medical): No    Lack of Transportation (Non-Medical): No  Physical Activity: Insufficiently Active (08/11/2023)   Exercise Vital Sign    Days of Exercise per Week: 1 day    Minutes of Exercise per Session: 50 min  Stress: No Stress Concern Present (08/11/2023)   [employer] of Occupational Health - Occupational Stress Questionnaire  Feeling of Stress : Not at all  Social Connections: Moderately Integrated (05/24/2024)   Social Connection and Isolation Panel    Frequency of Communication with Friends and Family: More than three times a week    Frequency of Social Gatherings with Friends and Family: More than three times a week    Attends Religious Services: More than 4 times per year    Active Member of Clubs or Organizations: No    Attends Banker Meetings: Never    Marital Status: Married  Catering Manager Violence: Not At Risk (05/24/2024)   Epic    Fear of Current or Ex-Partner: No    Emotionally  Abused: No    Physically Abused: No    Sexually Abused: No  Depression (PHQ2-9): Low Risk (08/13/2024)   Depression (PHQ2-9)    PHQ-2 Score: 0  Alcohol Screen: Low Risk (03/01/2024)   Alcohol Screen    Last Alcohol Screening Score (AUDIT): 0  Housing: Low Risk (05/24/2024)   Epic    Unable to Pay for Housing in the Last Year: No    Number of Times Moved in the Last Year: 0    Homeless in the Last Year: No  Utilities: Not At Risk (05/24/2024)   Epic    Threatened with loss of utilities: No  Health Literacy: Adequate Health Literacy (03/01/2024)   B1300 Health Literacy    Frequency of need for help with medical instructions: Never   Past Surgical History:  Procedure Laterality Date   COLONOSCOPY     EYE SURGERY  2022   JOINT REPLACEMENT  2019   left knee   ROBOT ASSISTED LAPAROSCOPIC RADICAL PROSTATECTOMY N/A 05/24/2024   Procedure: PROSTATECTOMY, RADICAL, ROBOT-ASSISTED, LAPAROSCOPIC;  Surgeon: Renda Glance, MD;  Location: [facility];  Service: Urology;  Laterality: N/A;  LEVEL 2   Family History  Problem Relation Age of Onset   Heart disease Mother    Alcohol abuse Mother    Alcohol abuse Father    Lung cancer Brother    Current Medications[1]  Allergies[2]   ROS: Review of Systems Pertinent items noted in HPI and remainder of comprehensive ROS otherwise negative.    Physical exam BP 124/77   Pulse (!) 56   Temp 98.2 F (36.8 C)   Ht 5' 6.5 (1.689 m)   Wt 180 lb (81.6 kg)   SpO2 97%   BMI 28.62 kg/m  General appearance: alert, cooperative, appears stated age, and no distress Head: Normocephalic, without obvious abnormality, atraumatic Eyes: negative findings: lids and lashes normal, conjunctivae and sclerae normal, corneas clear, and pupils equal, round, reactive to light and accomodation Ears: normal TM's and external ear canals both ears Nose: Nares normal. Septum midline. Mucosa normal. No drainage or sinus tenderness. Throat: lips, mucosa, and tongue  normal; teeth and gums normal Neck: no adenopathy, no carotid bruit, no JVD, supple, symmetrical, trachea midline, and thyroid not enlarged, symmetric, no tenderness/mass/nodules Back: symmetric, no curvature. ROM normal. No CVA tenderness. Lungs: clear to auscultation bilaterally Chest wall: no tenderness Heart: regular rate and rhythm, S1, S2 normal, no murmur, click, rub or gallop Abdomen: soft, non-tender; bowel sounds normal; no masses,  no organomegaly Extremities: extremities normal, atraumatic, no cyanosis or edema Pulses: 2+ and symmetric Skin: Skin color, texture, turgor normal. No rashes or lesions Lymph nodes: No supraclavicular or anterior cervical lymphadenopathy Neurologic: Alert and oriented X 3, normal strength and tone. Normal symmetric reflexes. Normal coordination and gait      08/13/2024  10:30 AM 03/01/2024    9:56 AM 08/12/2023   10:20 AM  Depression screen PHQ 2/9  Decreased Interest 0 0 0  Down, Depressed, Hopeless 0 0 0  PHQ - 2 Score 0 0 0  Altered sleeping   0  Tired, decreased energy   0  Change in appetite   0  Feeling bad or failure about yourself    0  Trouble concentrating   0  Moving slowly or fidgety/restless   0  Suicidal thoughts   0  PHQ-9 Score   0   Difficult doing work/chores   Not difficult at all     Data saved with a previous flowsheet row definition      08/13/2024   10:30 AM 08/12/2023   10:20 AM 04/26/2022   10:23 AM 06/27/2021    4:24 PM  GAD 7 : Generalized Anxiety Score  Nervous, Anxious, on Edge 0 0  0  0   Control/stop worrying 0 0  0  0   Worry too much - different things 0 0  0  0   Trouble relaxing 0 0  0  0   Restless 0 0  0  0   Easily annoyed or irritable 0 0  0  0   Afraid - awful might happen 0 0  0  0   Total GAD 7 Score 0 0 0 0  Anxiety Difficulty  Not difficult at all Not difficult at all Not difficult at all     Data saved with a previous flowsheet row definition    Recent Results (from the past 2160  hours)  ABO/Rh     Status: None   Collection Time: 05/24/24  9:05 AM  Result Value Ref Range   ABO/RH(D)      A POS Performed at [facility], [address] [place], [place] [address]   Surgical pathology     Status: None   Collection Time: 05/24/24 10:39 AM  Result Value Ref Range   SURGICAL PATHOLOGY      SURGICAL PATHOLOGY CASE: [phone] PATIENT: Perry Helbert Surgical Pathology Report     Clinical History: Prostate cancer (nt)     FINAL MICROSCOPIC DIAGNOSIS:  A. 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 LYMPH NODE, RIGHT PELVIC, BIOPSY: - Five lymph nodes, negative for carcinoma (0/5)  C. LYMPH NODE, LEFT PELVIC, BIOPSY: - Four lymph nodes, negative for carcinoma (0/4)      ONCOLOGY TABLE:  PROSTATE GLAND: Radical Prostatectomy  Procedure: Radical Prostatectomy Histologic Type: Prostatic acinar adenocarcinoma Histologic Grade, Grade Group and Gleason Score: Gleason scores 3+4 = 7 (grade group 2) Percentage of Pattern 4 in Gleason Score 7 (3+4, 4+3) Cancer (report if applicable): 5% Intraductal Carcinoma (Incorporated into grade): Present, focal Cribriform Gland (applicable to Glea son score 7 or 8 cancer only): Not identified Tumor Quantitation (estimated percentage of prostate involved by tumor): 15% Extraprostatic Extension: Not identified Urinary Bladder Neck Invasion: Not identified Seminal Vesicle Invasion: Not identified Margins: All margins negative for invasive carcinoma Lymphovascular Invasion: Not identified Treatment Effect: Not applicable, no know presurgical therapy Regional Lymph Nodes:      Number of Lymph Nodes with Tumor: 0      Number of Lymph Nodes Examined: 9 Distant Metastasis:      Distant Site(s) Involved:  Not applicable Pathologic Stage  Classification (pTNM, AJCC 8th Edition): pT2, pN0 Ancillary Studies: Can be performed upon request Representative Tumor Block: A12 Comment(s): None ([id number])      GROSS DESCRIPTION:  A. Specimen: received fresh and subsequently placed in formalin labeled with the patient's name and Prostate is a radical prostatectomy. Weight: 59 g Prostate size: 4.9 cm medial-lateral, 4.3  cm anterior-posterior, and 4.1 cm apex-base. Seminal vesicles size: right - 3.7 x 1.5 x 0.5 cm, left - 3.1 x 1.5 x 0.5 cm. Vasa deferentia: right - 3.8 x 0.4 cm, left - 3.4 x 0.4 cm. Inked margins: Left side yellow, right side black, staple line green Cut surface of prostate: tan and multinodular. A discrete mass or lesion is not grossly identified. Cut surface of seminal vesicles: grossly unremarkable. Block Summary: A1: right apex, perpendicular A2: left apex, perpendicular A3: right base, perpendicular A4: left base, perpendicular A5-A14: entire prostate, sequentially submitted from apex to base (SV in cassettes 13 and 14) A15: right SV and VD A16: left SV and VD  B. Received fresh and subsequently placed in formalin labeled with the patient's name and Right lymph are five tan, rubbery possible lymph nodes ranging from 0.3-3.8 cm in greatest dimension. The nodal tissue is entirely submitted as follows: B1: largest node, sectioned B2: one node, sectioned B3 : two nodes, sectioned (one inked black) B4: one node, whole  C. Received fresh and subsequently placed in formalin labeled with the patient's name and Left lymph are four tan, rubbery possible lymph nodes ranging from 0.2-2.8 cm in greatest dimension. The nodal tissue is entirely submitted as follows: C1: largest node, sectioned C2: one node, sectioned C3: two nodes, whole  (LEF 05/25/2024)  Final Diagnosis performed by Katrine Muskrat, MD.   Electronically signed 05/27/2024 Technical and / or Professional components performed at  [facility], [address], [place], [place] [address].  Immunohistochemistry Technical component (if applicable) was performed at [facility]. [address], [place], [place] [address].   IMMUNOHISTOCHEMISTRY DISCLAIMER (if applicable): Some of these immunohistochemical stains may have been developed and the performance characteristics determine by [facility]. Some may no t have been cleared or approved by the U.S. Food and Drug Administration. The FDA has determined that such clearance or approval is not necessary. This test is used for clinical purposes. It should not be regarded as investigational or for research. This laboratory is certified under the Clinical Laboratory Improvement Amendments of 1988 (CLIA-88) as qualified to perform high complexity clinical laboratory testing.  The controls stained appropriately.   IHC stains are performed on formalin fixed, paraffin embedded tissue using a 3,3diaminobenzidine (DAB) chromogen and Leica Bond Autostainer System. The staining intensity of the nucleus is score manually and is reported as the percentage of tumor cell nuclei demonstrating specific nuclear staining. The specimens are fixed in 10% Neutral Formalin for at least 6 hours and up to 72hrs. These tests are validated on decalcified tissue. Results should be interpreted with caution given the possibility of false negative results o n decalcified specimens. Antibody Clones are as follows ER-clone [id number], PR-clone 16, Ki67- clone [id number]. Some of these immunohistochemical stains may have been developed and the performance characteristics determined by [facility].   Hemoglobin and hematocrit, blood     Status: None   Collection Time: 05/24/24  2:22 PM  Result Value Ref Range   Hemoglobin 14.0 13.0 - 17.0 g/dL   HCT 55.9 60.9 - 47.9 %    Comment: Performed at  [facility], [address],  [place], [place] [address]  Hemoglobin and hematocrit, blood     Status: Abnormal   Collection Time: 05/25/24  5:47 AM  Result Value Ref Range   Hemoglobin 12.1 (L) 13.0 - 17.0 g/dL   HCT 62.4 (L) 60.9 - 47.9 %    Comment: Performed at [facility], [address] [place], [place] [address]     Assessment/ Plan: Garnette Generous here for annual physical exam.   Annual physical exam  History of prostate cancer - Plan: CMP14+EGFR, PSA, CBC with Differential  History of prostatectomy - Plan: CMP14+EGFR, PSA  Pure hypercholesterolemia - Plan: CMP14+EGFR, Lipid Panel  Elevated hemoglobin A1c - Plan: CMP14+EGFR, Bayer DCA Hb A1c Waived  Gastroesophageal reflux disease without esophagitis - Plan: CMP14+EGFR, CBC with Differential  Stress due to illness of family member   Declines vaccines  Fasting labs collected today.  Doing relatively well.  Managing stressors surrounding the illness of his wife fairly well independently with the support of his mosque and family.  I offered him counseling services and/or medical intervention should he need it.  At this time he politely declines but will reach out to me should concerns arise  Counseled on healthy lifestyle choices, including diet (rich in fruits, vegetables and lean meats and low in salt and simple carbohydrates) and exercise (at least 30 minutes of moderate physical activity daily).  Patient to follow up 1 year, sooner if concerns arise  Shaida Route M. Nicolaos Mitrano, DO        [1] No current outpatient medications on file. [2]  Allergies Allergen Reactions   Porcine (Pork) Protein-Containing Drug Products     Religious reasons   "

## 2024-08-13 ENCOUNTER — Ambulatory Visit (INDEPENDENT_AMBULATORY_CARE_PROVIDER_SITE_OTHER): Payer: Medicare Other | Admitting: Family Medicine

## 2024-08-13 ENCOUNTER — Encounter: Payer: Self-pay | Admitting: Family Medicine

## 2024-08-13 ENCOUNTER — Ambulatory Visit: Payer: Self-pay | Admitting: Family Medicine

## 2024-08-13 VITALS — BP 124/77 | HR 56 | Temp 98.2°F | Ht 66.5 in | Wt 180.0 lb

## 2024-08-13 DIAGNOSIS — R7309 Other abnormal glucose: Secondary | ICD-10-CM | POA: Diagnosis not present

## 2024-08-13 DIAGNOSIS — K219 Gastro-esophageal reflux disease without esophagitis: Secondary | ICD-10-CM

## 2024-08-13 DIAGNOSIS — E7800 Pure hypercholesterolemia, unspecified: Secondary | ICD-10-CM | POA: Diagnosis not present

## 2024-08-13 DIAGNOSIS — Z6379 Other stressful life events affecting family and household: Secondary | ICD-10-CM | POA: Diagnosis not present

## 2024-08-13 DIAGNOSIS — Z8546 Personal history of malignant neoplasm of prostate: Secondary | ICD-10-CM | POA: Diagnosis not present

## 2024-08-13 DIAGNOSIS — Z0000 Encounter for general adult medical examination without abnormal findings: Secondary | ICD-10-CM | POA: Diagnosis not present

## 2024-08-13 DIAGNOSIS — G629 Polyneuropathy, unspecified: Secondary | ICD-10-CM

## 2024-08-13 DIAGNOSIS — Z9079 Acquired absence of other genital organ(s): Secondary | ICD-10-CM | POA: Diagnosis not present

## 2024-08-13 LAB — CBC WITH DIFFERENTIAL/PLATELET
Basophils Absolute: 0.1 10*3/uL (ref 0.0–0.2)
Basos: 1 %
EOS (ABSOLUTE): 0.1 10*3/uL (ref 0.0–0.4)
Eos: 2 %
Hematocrit: 46.2 % (ref 37.5–51.0)
Hemoglobin: 15 g/dL (ref 13.0–17.7)
Immature Grans (Abs): 0 10*3/uL (ref 0.0–0.1)
Immature Granulocytes: 0 %
Lymphocytes Absolute: 1 10*3/uL (ref 0.7–3.1)
Lymphs: 17 %
MCH: 29.6 pg (ref 26.6–33.0)
MCHC: 32.5 g/dL (ref 31.5–35.7)
MCV: 91 fL (ref 79–97)
Monocytes Absolute: 0.4 10*3/uL (ref 0.1–0.9)
Monocytes: 7 %
Neutrophils Absolute: 4.1 10*3/uL (ref 1.4–7.0)
Neutrophils: 73 %
Platelets: 206 10*3/uL (ref 150–450)
RBC: 5.07 x10E6/uL (ref 4.14–5.80)
RDW: 12.7 % (ref 11.6–15.4)
WBC: 5.7 10*3/uL (ref 3.4–10.8)

## 2024-08-13 LAB — LIPID PANEL
Chol/HDL Ratio: 4 ratio (ref 0.0–5.0)
Cholesterol, Total: 162 mg/dL (ref 100–199)
HDL: 41 mg/dL
LDL Chol Calc (NIH): 93 mg/dL (ref 0–99)
Triglycerides: 159 mg/dL — ABNORMAL HIGH (ref 0–149)
VLDL Cholesterol Cal: 28 mg/dL (ref 5–40)

## 2024-08-13 LAB — CMP14+EGFR
ALT: 17 [IU]/L (ref 0–44)
AST: 17 [IU]/L (ref 0–40)
Albumin: 4.5 g/dL (ref 3.9–4.9)
Alkaline Phosphatase: 70 [IU]/L (ref 47–123)
BUN/Creatinine Ratio: 10 (ref 10–24)
BUN: 10 mg/dL (ref 8–27)
Bilirubin Total: 0.4 mg/dL (ref 0.0–1.2)
CO2: 23 mmol/L (ref 20–29)
Calcium: 9.8 mg/dL (ref 8.6–10.2)
Chloride: 102 mmol/L (ref 96–106)
Creatinine, Ser: 1.04 mg/dL (ref 0.76–1.27)
Globulin, Total: 2.2 g/dL (ref 1.5–4.5)
Glucose: 104 mg/dL — ABNORMAL HIGH (ref 70–99)
Potassium: 4.9 mmol/L (ref 3.5–5.2)
Sodium: 139 mmol/L (ref 134–144)
Total Protein: 6.7 g/dL (ref 6.0–8.5)
eGFR: 79 mL/min/{1.73_m2}

## 2024-08-13 LAB — BAYER DCA HB A1C WAIVED: HB A1C (BAYER DCA - WAIVED): 5.7 % — ABNORMAL HIGH (ref 4.8–5.6)

## 2024-08-13 LAB — PSA: Prostate Specific Ag, Serum: <0.1 ng/mL (ref 0.0–4.0)

## 2025-03-02 ENCOUNTER — Ambulatory Visit: Payer: Self-pay

## 2025-08-15 ENCOUNTER — Encounter: Admitting: Family Medicine
